# Patient Record
Sex: Male | Born: 1980 | Race: Black or African American | Hispanic: No | Marital: Single | Smoking: Current every day smoker
Health system: Southern US, Community
[De-identification: ages and names within clinical notes are randomized; demographics above are authoritative.]

## PROBLEM LIST (undated history)

## (undated) DIAGNOSIS — I10 Essential (primary) hypertension: Secondary | ICD-10-CM

## (undated) DIAGNOSIS — F329 Major depressive disorder, single episode, unspecified: Secondary | ICD-10-CM

## (undated) DIAGNOSIS — E119 Type 2 diabetes mellitus without complications: Secondary | ICD-10-CM

## (undated) DIAGNOSIS — M1990 Unspecified osteoarthritis, unspecified site: Secondary | ICD-10-CM

## (undated) DIAGNOSIS — F419 Anxiety disorder, unspecified: Secondary | ICD-10-CM

## (undated) DIAGNOSIS — F32A Depression, unspecified: Secondary | ICD-10-CM

## (undated) HISTORY — DX: Type 2 diabetes mellitus without complications: E11.9

## (undated) HISTORY — DX: Essential (primary) hypertension: I10

---

## 2003-11-02 ENCOUNTER — Emergency Department (HOSPITAL_COMMUNITY): Admission: EM | Admit: 2003-11-02 | Discharge: 2003-11-03 | Payer: Self-pay | Admitting: Emergency Medicine

## 2003-11-18 ENCOUNTER — Emergency Department (HOSPITAL_COMMUNITY): Admission: EM | Admit: 2003-11-18 | Discharge: 2003-11-18 | Payer: Self-pay | Admitting: Emergency Medicine

## 2005-12-28 ENCOUNTER — Emergency Department (HOSPITAL_COMMUNITY): Admission: EM | Admit: 2005-12-28 | Discharge: 2005-12-28 | Payer: Self-pay | Admitting: Emergency Medicine

## 2007-10-16 ENCOUNTER — Emergency Department (HOSPITAL_COMMUNITY): Admission: EM | Admit: 2007-10-16 | Discharge: 2007-10-16 | Payer: Self-pay | Admitting: Emergency Medicine

## 2008-08-09 ENCOUNTER — Emergency Department (HOSPITAL_COMMUNITY): Admission: AC | Admit: 2008-08-09 | Discharge: 2008-08-10 | Payer: Self-pay

## 2010-08-07 LAB — POCT I-STAT, CHEM 8
BUN: 15 mg/dL (ref 6–23)
Calcium, Ion: 1.01 mmol/L — ABNORMAL LOW (ref 1.12–1.32)
Creatinine, Ser: 1 mg/dL (ref 0.4–1.5)
Glucose, Bld: 98 mg/dL (ref 70–99)
HCT: 44 % (ref 39.0–52.0)
Potassium: 3.3 meq/L — ABNORMAL LOW (ref 3.5–5.1)
Sodium: 140 meq/L (ref 135–145)

## 2010-08-07 LAB — TYPE AND SCREEN: ABO/RH(D): O POS

## 2010-08-07 LAB — ABO/RH: ABO/RH(D): O POS

## 2011-09-28 ENCOUNTER — Emergency Department (HOSPITAL_COMMUNITY): Payer: Self-pay | Admitting: Certified Registered"

## 2011-09-28 ENCOUNTER — Encounter (HOSPITAL_COMMUNITY): Payer: Self-pay

## 2011-09-28 ENCOUNTER — Observation Stay (HOSPITAL_COMMUNITY)
Admission: EM | Admit: 2011-09-28 | Discharge: 2011-09-29 | Disposition: A | Discharge status: Home / Self Care | Payer: Self-pay | Attending: Emergency Medicine | Admitting: Emergency Medicine

## 2011-09-28 ENCOUNTER — Other Ambulatory Visit: Payer: Self-pay

## 2011-09-28 ENCOUNTER — Encounter (HOSPITAL_COMMUNITY): Payer: Self-pay | Admitting: Certified Registered"

## 2011-09-28 ENCOUNTER — Encounter (HOSPITAL_COMMUNITY)
Admission: EM | Disposition: A | Discharge status: Home / Self Care | Payer: Self-pay | Source: Home / Self Care | Attending: Emergency Medicine

## 2011-09-28 ENCOUNTER — Emergency Department (HOSPITAL_COMMUNITY): Payer: Self-pay

## 2011-09-28 ENCOUNTER — Observation Stay (HOSPITAL_COMMUNITY): Payer: Self-pay

## 2011-09-28 ENCOUNTER — Encounter (HOSPITAL_COMMUNITY): Payer: Self-pay | Admitting: *Deleted

## 2011-09-28 DIAGNOSIS — S82109B Unspecified fracture of upper end of unspecified tibia, initial encounter for open fracture type I or II: Principal | ICD-10-CM | POA: Insufficient documentation

## 2011-09-28 DIAGNOSIS — S81009A Unspecified open wound, unspecified knee, initial encounter: Secondary | ICD-10-CM | POA: Insufficient documentation

## 2011-09-28 DIAGNOSIS — W3400XA Accidental discharge from unspecified firearms or gun, initial encounter: Secondary | ICD-10-CM

## 2011-09-28 DIAGNOSIS — S51809A Unspecified open wound of unspecified forearm, initial encounter: Secondary | ICD-10-CM | POA: Insufficient documentation

## 2011-09-28 DIAGNOSIS — S91009A Unspecified open wound, unspecified ankle, initial encounter: Secondary | ICD-10-CM | POA: Insufficient documentation

## 2011-09-28 HISTORY — PX: IRRIGATION AND DEBRIDEMENT KNEE: SHX5185

## 2011-09-28 HISTORY — PX: KNEE ARTHROSCOPY: SHX127

## 2011-09-28 HISTORY — DX: Major depressive disorder, single episode, unspecified: F32.9

## 2011-09-28 HISTORY — DX: Depression, unspecified: F32.A

## 2011-09-28 HISTORY — PX: INCISION AND DRAINAGE OF WOUND: SHX1803

## 2011-09-28 LAB — POCT I-STAT, CHEM 8
BUN: 17 mg/dL (ref 6–23)
Creatinine, Ser: 1 mg/dL (ref 0.50–1.35)
Glucose, Bld: 146 mg/dL — ABNORMAL HIGH (ref 70–99)
Potassium: 3.3 mEq/L — ABNORMAL LOW (ref 3.5–5.1)
Sodium: 141 mEq/L (ref 135–145)
TCO2: 22 mmol/L (ref 0–100)

## 2011-09-28 LAB — CBC
MCHC: 34 g/dL (ref 30.0–36.0)
MCV: 85.9 fL (ref 78.0–100.0)

## 2011-09-28 LAB — COMPREHENSIVE METABOLIC PANEL
ALT: 29 U/L (ref 0–53)
AST: 21 U/L (ref 0–37)
Albumin: 3.6 g/dL (ref 3.5–5.2)
Alkaline Phosphatase: 97 U/L (ref 39–117)
CO2: 24 mEq/L (ref 19–32)
Calcium: 9.1 mg/dL (ref 8.4–10.5)
GFR calc Af Amer: >90 mL/min (ref >90)
GFR calc non Af Amer: >90 mL/min (ref >90)
Potassium: 3.2 mEq/L — ABNORMAL LOW (ref 3.5–5.1)
Total Bilirubin: 0.3 mg/dL (ref 0.3–1.2)
Total Protein: 7.1 g/dL (ref 6.0–8.3)

## 2011-09-28 LAB — PROTIME-INR: INR: 1.09 (ref 0.00–1.49)

## 2011-09-28 SURGERY — ARTHROSCOPY, KNEE
Anesthesia: General | Site: Knee | Laterality: Right | Wound class: Dirty or Infected

## 2011-09-28 MED ORDER — OXYCODONE HCL 5 MG PO TABS
5.0000 mg | ORAL_TABLET | ORAL | Status: DC | PRN
  Administered 2011-09-28 – 2011-09-29 (×5): 10 mg via ORAL
  Filled 2011-09-28 (×5): qty 2

## 2011-09-28 MED ORDER — CEFAZOLIN SODIUM 1-5 GM-% IV SOLN
1.0000 g | Freq: Four times a day (QID) | INTRAVENOUS | Status: AC
  Administered 2011-09-28 – 2011-09-29 (×3): 1 g via INTRAVENOUS
  Filled 2011-09-28 (×3): qty 50

## 2011-09-28 MED ORDER — METOCLOPRAMIDE HCL 5 MG/ML IJ SOLN: 5.0000 mg | Freq: Three times a day (TID) | INTRAMUSCULAR | Status: DC | PRN

## 2011-09-28 MED ORDER — CEFAZOLIN SODIUM 1-5 GM-% IV SOLN
INTRAVENOUS | Status: AC
  Administered 2011-09-28: 1000 mg
  Administered 2011-09-28: 1 g via INTRAVENOUS
  Filled 2011-09-28: qty 50

## 2011-09-28 MED ORDER — ONDANSETRON HCL 4 MG PO TABS: 4.0000 mg | ORAL_TABLET | Freq: Four times a day (QID) | ORAL | Status: DC | PRN

## 2011-09-28 MED ORDER — SODIUM CHLORIDE 0.9 % IR SOLN
Status: DC | PRN
  Administered 2011-09-28: 6000 mL

## 2011-09-28 MED ORDER — SUCCINYLCHOLINE CHLORIDE 20 MG/ML IJ SOLN
INTRAMUSCULAR | Status: DC | PRN
  Administered 2011-09-28: 100 mg via INTRAVENOUS

## 2011-09-28 MED ORDER — POTASSIUM CHLORIDE IN NACL 20-0.9 MEQ/L-% IV SOLN
INTRAVENOUS | Status: AC
  Administered 2011-09-28: 16:00:00 via INTRAVENOUS
  Filled 2011-09-28: qty 1000

## 2011-09-28 MED ORDER — FENTANYL CITRATE 0.05 MG/ML IJ SOLN
INTRAMUSCULAR | Status: AC
  Administered 2011-09-28: 50 ug
  Filled 2011-09-28: qty 2

## 2011-09-28 MED ORDER — KETOROLAC TROMETHAMINE 30 MG/ML IJ SOLN
30.0000 mg | Freq: Once | INTRAMUSCULAR | Status: AC
  Administered 2011-09-28: 30 mg via INTRAVENOUS

## 2011-09-28 MED ORDER — CLONIDINE HCL (ANALGESIA) 100 MCG/ML EP SOLN
150.0000 ug | Freq: Once | EPIDURAL | Status: DC
  Filled 2011-09-28: qty 1.5

## 2011-09-28 MED ORDER — MORPHINE SULFATE 4 MG/ML IJ SOLN
8.0000 mg | Freq: Once | INTRAMUSCULAR | Status: DC
  Filled 2011-09-28: qty 2

## 2011-09-28 MED ORDER — METHOCARBAMOL 500 MG PO TABS
500.0000 mg | ORAL_TABLET | Freq: Four times a day (QID) | ORAL | Status: DC | PRN
  Administered 2011-09-28 – 2011-09-29 (×2): 500 mg via ORAL
  Filled 2011-09-28 (×2): qty 1

## 2011-09-28 MED ORDER — ONDANSETRON HCL 4 MG/2ML IJ SOLN: 4.0000 mg | Freq: Four times a day (QID) | INTRAMUSCULAR | Status: DC | PRN

## 2011-09-28 MED ORDER — PROMETHAZINE HCL 25 MG/ML IJ SOLN: 6.2500 mg | INTRAMUSCULAR | Status: DC | PRN

## 2011-09-28 MED ORDER — HYDROMORPHONE HCL PF 1 MG/ML IJ SOLN: 0.2500 mg | INTRAMUSCULAR | Status: DC | PRN

## 2011-09-28 MED ORDER — MIDAZOLAM HCL 5 MG/5ML IJ SOLN
INTRAMUSCULAR | Status: DC | PRN
  Administered 2011-09-28 (×2): 1 mg via INTRAVENOUS

## 2011-09-28 MED ORDER — FENTANYL CITRATE 0.05 MG/ML IJ SOLN
INTRAMUSCULAR | Status: DC | PRN
  Administered 2011-09-28: 100 ug via INTRAVENOUS
  Administered 2011-09-28 (×2): 150 ug via INTRAVENOUS
  Administered 2011-09-28: 50 ug via INTRAVENOUS

## 2011-09-28 MED ORDER — TETANUS-DIPHTH-ACELL PERTUSSIS 5-2.5-18.5 LF-MCG/0.5 IM SUSP
INTRAMUSCULAR | Status: AC
  Administered 2011-09-28: 0.5 mL via INTRAMUSCULAR
  Filled 2011-09-28: qty 0.5

## 2011-09-28 MED ORDER — TETANUS-DIPHTH-ACELL PERTUSSIS 5-2.5-18.5 LF-MCG/0.5 IM SUSP: 0.5000 mL | Freq: Once | INTRAMUSCULAR | Status: DC

## 2011-09-28 MED ORDER — LACTATED RINGERS IV SOLN
INTRAVENOUS | Status: DC | PRN
  Administered 2011-09-28: 10:00:00 via INTRAVENOUS

## 2011-09-28 MED ORDER — METHOCARBAMOL 100 MG/ML IJ SOLN
500.0000 mg | Freq: Four times a day (QID) | INTRAVENOUS | Status: DC | PRN
  Filled 2011-09-28: qty 5

## 2011-09-28 MED ORDER — ACETAMINOPHEN 10 MG/ML IV SOLN
INTRAVENOUS | Status: DC | PRN
  Administered 2011-09-28: 1000 mg via INTRAVENOUS

## 2011-09-28 MED ORDER — SODIUM CHLORIDE 0.9 % IR SOLN
Status: DC | PRN
  Administered 2011-09-28: 1000 mL

## 2011-09-28 MED ORDER — HYDROMORPHONE HCL PF 1 MG/ML IJ SOLN: 0.5000 mg | INTRAMUSCULAR | Status: DC | PRN

## 2011-09-28 MED ORDER — ONDANSETRON HCL 4 MG/2ML IJ SOLN
INTRAMUSCULAR | Status: DC | PRN
  Administered 2011-09-28: 4 mg via INTRAVENOUS

## 2011-09-28 MED ORDER — CIPROFLOXACIN IN D5W 400 MG/200ML IV SOLN
400.0000 mg | Freq: Two times a day (BID) | INTRAVENOUS | Status: AC
  Administered 2011-09-28 – 2011-09-29 (×2): 400 mg via INTRAVENOUS
  Filled 2011-09-28 (×2): qty 200

## 2011-09-28 MED ORDER — MIDAZOLAM HCL 2 MG/2ML IJ SOLN: 0.5000 mg | Freq: Once | INTRAMUSCULAR | Status: DC | PRN

## 2011-09-28 MED ORDER — METOCLOPRAMIDE HCL 10 MG PO TABS: 5.0000 mg | ORAL_TABLET | Freq: Three times a day (TID) | ORAL | Status: DC | PRN

## 2011-09-28 MED ORDER — MEPERIDINE HCL 25 MG/ML IJ SOLN: 6.2500 mg | INTRAMUSCULAR | Status: DC | PRN

## 2011-09-28 MED ORDER — PROPOFOL 10 MG/ML IV EMUL
INTRAVENOUS | Status: DC | PRN
  Administered 2011-09-28: 200 mg via INTRAVENOUS

## 2011-09-28 SURGICAL SUPPLY — 79 items
BANDAGE ELASTIC 4 VELCRO ST LF (GAUZE/BANDAGES/DRESSINGS) ×6 IMPLANT
BANDAGE ELASTIC 6 VELCRO ST LF (GAUZE/BANDAGES/DRESSINGS) ×3 IMPLANT
BANDAGE ESMARK 6X9 LF (GAUZE/BANDAGES/DRESSINGS) ×2 IMPLANT
BENZOIN TINCTURE PRP APPL 2/3 (GAUZE/BANDAGES/DRESSINGS) IMPLANT
BLADE CUDA 5.5 (BLADE) IMPLANT
BLADE GREAT WHITE 4.2 (BLADE) IMPLANT
BLADE SURG 10 STRL SS (BLADE) IMPLANT
BLADE SURG 11 STRL SS (BLADE) IMPLANT
BLADE SURG 15 STRL LF DISP TIS (BLADE) IMPLANT
BLADE SURG 15 STRL SS (BLADE)
BLADE SURG ROTATE 9660 (MISCELLANEOUS) IMPLANT
BNDG ESMARK 6X9 LF (GAUZE/BANDAGES/DRESSINGS) ×3
BUR OVAL 6.0 (BURR) IMPLANT
CANNULA ACUFLEX KIT 5X76 (CANNULA) ×3 IMPLANT
CLOTH BEACON ORANGE TIMEOUT ST (SAFETY) ×3 IMPLANT
CONT SPEC STER OR (MISCELLANEOUS) ×3 IMPLANT
COVER MAYO STAND STRL (DRAPES) IMPLANT
COVER SURGICAL LIGHT HANDLE (MISCELLANEOUS) ×3 IMPLANT
CUFF TOURNIQUET SINGLE 34IN LL (TOURNIQUET CUFF) ×3 IMPLANT
CUFF TOURNIQUET SINGLE 44IN (TOURNIQUET CUFF) IMPLANT
DRAPE ARTHROSCOPY W/POUCH 114 (DRAPES) ×3 IMPLANT
DRAPE INCISE IOBAN 66X45 STRL (DRAPES) ×3 IMPLANT
DRAPE PROXIMA HALF (DRAPES) IMPLANT
DRAPE U-SHAPE 47X51 STRL (DRAPES) ×6 IMPLANT
DRSG PAD ABDOMINAL 8X10 ST (GAUZE/BANDAGES/DRESSINGS) ×6 IMPLANT
DURAPREP 26ML APPLICATOR (WOUND CARE) IMPLANT
ELECT REM PT RETURN 9FT ADLT (ELECTROSURGICAL)
ELECTRODE REM PT RTRN 9FT ADLT (ELECTROSURGICAL) IMPLANT
GAUZE XEROFORM 1X8 LF (GAUZE/BANDAGES/DRESSINGS) ×6 IMPLANT
GLOVE BIO SURGEON ST LM GN SZ9 (GLOVE) IMPLANT
GLOVE BIOGEL PI IND STRL 8 (GLOVE) ×2 IMPLANT
GLOVE BIOGEL PI IND STRL 9 (GLOVE) IMPLANT
GLOVE BIOGEL PI INDICATOR 8 (GLOVE) ×1
GLOVE BIOGEL PI INDICATOR 9 (GLOVE)
GLOVE SURG ORTHO 8.0 STRL STRW (GLOVE) ×3 IMPLANT
GOWN PREVENTION PLUS LG XLONG (DISPOSABLE) IMPLANT
GOWN PREVENTION PLUS XLARGE (GOWN DISPOSABLE) IMPLANT
GOWN STRL NON-REIN LRG LVL3 (GOWN DISPOSABLE) ×6 IMPLANT
IMMOBILIZER KNEE 22 UNIV (SOFTGOODS) ×3 IMPLANT
KIT BASIN OR (CUSTOM PROCEDURE TRAY) ×3 IMPLANT
KIT ROOM TURNOVER OR (KITS) ×3 IMPLANT
MANIFOLD NEPTUNE II (INSTRUMENTS) ×3 IMPLANT
NEEDLE 18GX1X1/2 (RX/OR ONLY) (NEEDLE) ×3 IMPLANT
NEEDLE HYPO 25GX1X1/2 BEV (NEEDLE) ×3 IMPLANT
NEEDLE SPNL 18GX3.5 QUINCKE PK (NEEDLE) IMPLANT
NS IRRIG 1000ML POUR BTL (IV SOLUTION) IMPLANT
PACK ARTHROSCOPY DSU (CUSTOM PROCEDURE TRAY) ×3 IMPLANT
PAD ARMBOARD 7.5X6 YLW CONV (MISCELLANEOUS) ×6 IMPLANT
PAD CAST 4YDX4 CTTN HI CHSV (CAST SUPPLIES) ×2 IMPLANT
PADDING CAST COTTON 4X4 STRL (CAST SUPPLIES) ×1
PADDING CAST COTTON 6X4 STRL (CAST SUPPLIES) ×3 IMPLANT
PENCIL BUTTON HOLSTER BLD 10FT (ELECTRODE) IMPLANT
SET ARTHROSCOPY TUBING (MISCELLANEOUS) ×1
SET ARTHROSCOPY TUBING LN (MISCELLANEOUS) ×2 IMPLANT
SPONGE GAUZE 4X4 12PLY (GAUZE/BANDAGES/DRESSINGS) ×6 IMPLANT
SPONGE LAP 4X18 X RAY DECT (DISPOSABLE) ×3 IMPLANT
STAPLER VISISTAT 35W (STAPLE) IMPLANT
STRIP CLOSURE SKIN 1/2X4 (GAUZE/BANDAGES/DRESSINGS) IMPLANT
SUCTION FRAZIER TIP 10 FR DISP (SUCTIONS) IMPLANT
SUT ETHIBOND CT1 BRD #0 30IN (SUTURE) IMPLANT
SUT ETHILON 3 0 PS 1 (SUTURE) ×9 IMPLANT
SUT FIBERWIRE 2-0 18 17.9 3/8 (SUTURE) ×6
SUT MENISCAL KIT (KITS) IMPLANT
SUT PROLENE 3 0 PS 2 (SUTURE) IMPLANT
SUT VIC AB 0 CT1 27 (SUTURE)
SUT VIC AB 0 CT1 27XBRD ANBCTR (SUTURE) IMPLANT
SUT VIC AB 2-0 CTB1 (SUTURE) IMPLANT
SUT VIC AB 2-0 FS1 27 (SUTURE) ×12 IMPLANT
SUT VIC AB 3-0 FS2 27 (SUTURE) ×9 IMPLANT
SUTURE FIBERWR 2-0 18 17.9 3/8 (SUTURE) ×4 IMPLANT
SYR 20ML ECCENTRIC (SYRINGE) IMPLANT
SYR BULB IRRIGATION 50ML (SYRINGE) IMPLANT
SYR CONTROL 10ML LL (SYRINGE) ×3 IMPLANT
SYR TB 1ML LUER SLIP (SYRINGE) ×3 IMPLANT
TOWEL OR 17X24 6PK STRL BLUE (TOWEL DISPOSABLE) ×3 IMPLANT
TOWEL OR 17X26 10 PK STRL BLUE (TOWEL DISPOSABLE) ×3 IMPLANT
TUBE CONNECTING 12X1/4 (SUCTIONS) ×3 IMPLANT
WAND 90 DEG TURBOVAC W/CORD (SURGICAL WAND) ×3 IMPLANT
WATER STERILE IRR 1000ML POUR (IV SOLUTION) ×3 IMPLANT

## 2011-09-28 NOTE — Anesthesia Postprocedure Evaluation (Signed)
  Anesthesia Post-op Note  Patient: Randy Potter  Procedure(s) Performed: Procedure(s) (LRB): ARTHROSCOPY KNEE (Left) IRRIGATION AND DEBRIDEMENT KNEE (Left) IRRIGATION AND DEBRIDEMENT WOUND (Right)  Patient Location: PACU  Anesthesia Type: General  Level of Consciousness: awake, alert  and oriented  Airway and Oxygen Therapy: Patient Spontanous Breathing and Patient connected to nasal cannula oxygen  Post-op Pain: none  Post-op Assessment: Post-op Vital signs reviewed, Patient's Cardiovascular Status Stable, Respiratory Function Stable, Patent Airway, No signs of Nausea or vomiting and Pain level controlled  Post-op Vital Signs: Reviewed and stable  Complications: No apparent anesthesia complications
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
## 2011-09-28 NOTE — ED Notes (Signed)
Gave patients mother the pass word. Last 4 #s of the csn.  [phone]

## 2011-09-28 NOTE — Progress Notes (Signed)
09/28/11 1058  OTHER  CSW Follow Up Status Follow-up required     Unit based LCSW will be available for psychosocial support and disposition needs.

## 2011-09-28 NOTE — Anesthesia Preprocedure Evaluation (Signed)
Anesthesia Evaluation  Patient identified by MRN, date of birth, ID band Patient awake    Reviewed: Allergy & Precautions, H&P , NPO status , Patient's Chart, lab work & pertinent test results  History of Anesthesia Complications Negative for: history of anesthetic complications  Airway Mallampati: II TM Distance: >3 FB Neck ROM: Full    Dental No notable dental hx. (+) Teeth Intact and Dental Advisory Given   Pulmonary Current Smoker (tobacco and marijuana),  breath sounds clear to auscultation  Pulmonary exam normal       Cardiovascular negative cardio ROS  Rhythm:Regular Rate:Normal     Neuro/Psych negative neurological ROS  negative psych ROS   GI/Hepatic negative GI ROS, Neg liver ROS,   Endo/Other  negative endocrine ROS  Renal/GU negative Renal ROS     Musculoskeletal   Abdominal (+) + obese,   Peds  Hematology negative hematology ROS (+)   Anesthesia Other Findings   Reproductive/Obstetrics                           Anesthesia Physical Anesthesia Plan  ASA: II and Emergent  Anesthesia Plan: General   Post-op Pain Management:    Induction: Intravenous, Rapid sequence and Cricoid pressure planned  Airway Management Planned: Oral ETT  Additional Equipment:   Intra-op Plan:   Post-operative Plan: Extubation in OR  Informed Consent: I have reviewed the patients History and Physical, chart, labs and discussed the procedure including the risks, benefits and alternatives for the proposed anesthesia with the patient or authorized representative who has indicated his/her understanding and acceptance.   Dental advisory given  Plan Discussed with: CRNA and Surgeon  Anesthesia Plan Comments: (Plan routine monitors, GETA with RSI)        Anesthesia Quick Evaluation
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
## 2011-09-28 NOTE — ED Notes (Signed)
Dr. August Saucer at the bedside.

## 2011-09-28 NOTE — ED Provider Notes (Signed)
Medical screening examination/treatment/procedure(s) were performed by non-physician practitioner and as supervising physician I was immediately available for consultation/collaboration.  Sunnie Nielsen, MD 09/28/11 2300

## 2011-09-28 NOTE — ED Notes (Signed)
Pt taken to OR.
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
## 2011-09-28 NOTE — Anesthesia Procedure Notes (Signed)
Procedure Name: Intubation Date/Time: 09/28/2011 10:42 AM Performed by: Arlice Colt B Pre-anesthesia Checklist: Patient identified, Emergency Drugs available, Suction available, Patient being monitored and Timeout performed Patient Re-evaluated:Patient Re-evaluated prior to inductionOxygen Delivery Method: Circle system utilized Preoxygenation: Pre-oxygenation with 100% oxygen Intubation Type: IV induction and Rapid sequence Laryngoscope Size: Mac and 3 Grade View: Grade I Tube type: Oral Tube size: 7.5 mm Number of attempts: 1 Airway Equipment and Method: Stylet Placement Confirmation: ETT inserted through vocal cords under direct vision,  positive ETCO2 and breath sounds checked- equal and bilateral Secured at: 23 cm Tube secured with: Tape Dental Injury: Teeth and Oropharynx as per pre-operative assessment

## 2011-09-28 NOTE — Transfer of Care (Signed)
Immediate Anesthesia Transfer of Care Note  Patient: Randy Potter  Procedure(s) Performed: Procedure(s) (LRB): ARTHROSCOPY KNEE (Left) IRRIGATION AND DEBRIDEMENT KNEE (Left) IRRIGATION AND DEBRIDEMENT WOUND (Right)  Patient Location: PACU  Anesthesia Type: General  Level of Consciousness: awake, alert , oriented and patient cooperative  Airway & Oxygen Therapy: Patient Spontanous Breathing and Patient connected to nasal cannula oxygen  Post-op Assessment: Report given to PACU RN, Post -op Vital signs reviewed and stable and Patient moving all extremities  Post vital signs: Reviewed and stable  Complications: No apparent anesthesia complications
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
## 2011-09-29 ENCOUNTER — Encounter (HOSPITAL_COMMUNITY): Payer: Self-pay | Admitting: Orthopedic Surgery

## 2011-09-29 MED ORDER — OXYCODONE HCL 5 MG PO TABS: 5.0000 mg | ORAL_TABLET | ORAL | Status: AC | PRN

## 2011-09-29 MED ORDER — METHOCARBAMOL 500 MG PO TABS: 500.0000 mg | ORAL_TABLET | Freq: Four times a day (QID) | ORAL | Status: AC | PRN

## 2011-09-29 MED ORDER — ASPIRIN EC 325 MG PO TBEC: 325.0000 mg | DELAYED_RELEASE_TABLET | Freq: Every day | ORAL | Status: AC
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
## 2011-09-29 NOTE — Progress Notes (Signed)
Orthopedic Tech Progress Note Patient Details:  Randy Potter 1980-11-17 [id number]  Ortho Devices Type of Ortho Device: Crutches Ortho Device/Splint Interventions: Application   Nikki Dom 09/29/2011, 4:23 PM

## 2011-09-29 NOTE — Progress Notes (Signed)
Orthopedic Tech Progress Note Patient Details:  Randy Potter 27-Apr-1981 [id number]  Patient ID: [name], male   DOB: Dec 11, 1980, 31 y.o.   MRN: [id number] Viewed order from rn order list  Nikki Dom 09/29/2011, 4:24 PM

## 2011-09-29 NOTE — Progress Notes (Signed)
Utilization review completed. Tobe Kervin, RN, BSN. 09/29/11  
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
## 2013-11-13 ENCOUNTER — Encounter (HOSPITAL_COMMUNITY): Payer: Self-pay | Admitting: Emergency Medicine

## 2013-11-13 ENCOUNTER — Emergency Department (HOSPITAL_COMMUNITY)
Admission: EM | Admit: 2013-11-13 | Discharge: 2013-11-13 | Disposition: A | Discharge status: Home / Self Care | Payer: Medicaid Other | Attending: Emergency Medicine | Admitting: Emergency Medicine

## 2013-11-13 DIAGNOSIS — Z8659 Personal history of other mental and behavioral disorders: Secondary | ICD-10-CM | POA: Insufficient documentation

## 2013-11-13 DIAGNOSIS — R300 Dysuria: Secondary | ICD-10-CM | POA: Diagnosis not present

## 2013-11-13 DIAGNOSIS — F17200 Nicotine dependence, unspecified, uncomplicated: Secondary | ICD-10-CM | POA: Diagnosis not present

## 2013-11-13 DIAGNOSIS — R369 Urethral discharge, unspecified: Secondary | ICD-10-CM

## 2013-11-13 MED ORDER — CEFTRIAXONE SODIUM 250 MG IJ SOLR
250.0000 mg | Freq: Once | INTRAMUSCULAR | Status: AC
  Administered 2013-11-13: 250 mg via INTRAMUSCULAR
  Filled 2013-11-13: qty 250

## 2013-11-13 MED ORDER — LIDOCAINE HCL (PF) 1 % IJ SOLN
5.0000 mL | Freq: Once | INTRAMUSCULAR | Status: AC
  Administered 2013-11-13: 0.9 mL
  Filled 2013-11-13: qty 5

## 2013-11-13 MED ORDER — ONDANSETRON HCL 4 MG/2ML IJ SOLN: 4.0000 mg | Freq: Once | INTRAMUSCULAR | Status: DC

## 2013-11-13 MED ORDER — ONDANSETRON 4 MG PO TBDP
4.0000 mg | ORAL_TABLET | Freq: Once | ORAL | Status: AC
  Administered 2013-11-13: 4 mg via ORAL
  Filled 2013-11-13: qty 1

## 2013-11-13 MED ORDER — METRONIDAZOLE 500 MG PO TABS
2000.0000 mg | ORAL_TABLET | Freq: Once | ORAL | Status: AC
  Administered 2013-11-13: 2000 mg via ORAL
  Filled 2013-11-13: qty 4

## 2013-11-13 MED ORDER — AZITHROMYCIN 250 MG PO TABS
1000.0000 mg | ORAL_TABLET | Freq: Once | ORAL | Status: AC
  Administered 2013-11-13: 1000 mg via ORAL
  Filled 2013-11-13: qty 4

## 2013-11-13 NOTE — ED Provider Notes (Signed)
CSN: [id number]     Arrival date & time 11/13/13  1401 History  This chart was scribed for Arthor CaptainAbigail Keeghan Mcintire, PA-C non-physician practitioner working with Juliet RudeNathan R. Rubin PayorPickering, MD by Nicholos Johnsenise Iheanachor, ED scribe. This patient was seen in room TR08C/TR08C and the patient's care was started at 3:57 PM.    Chief Complaint  Patient presents with  . SEXUALLY TRANSMITTED DISEASE   The history is provided by the patient. No language interpreter was used.   HPI Comments: Addison LankWayne D Vandeusen is a 33 y.o. male who presents to the Emergency Department complaining of penile discharge w/ dysuria; onset 3 days ago. Admits to unprotected sex recently. No hx of STD. No other lesions to report. Denies testicular pain.   Past Medical History  Diagnosis Date  . Depression    Past Surgical History  Procedure Laterality Date  . Knee arthroscopy  09/28/2011    Procedure: ARTHROSCOPY KNEE;  Surgeon: Cammy CopaGregory Scott Dean, MD;  [facility];  Service: Orthopedics;  Laterality: Left;  . Irrigation and debridement knee  09/28/2011    Procedure: IRRIGATION AND DEBRIDEMENT KNEE;  Surgeon: Cammy CopaGregory Scott Dean, MD;  [facility];  Service: Orthopedics;  Laterality: Left;  . Incision and drainage of wound  09/28/2011    Procedure: IRRIGATION AND DEBRIDEMENT WOUND;  Surgeon: Cammy CopaGregory Scott Dean, MD;  [facility];  Service: Orthopedics;  Laterality: Right;  Complex closure of laceration   History reviewed. No pertinent family history. History  Substance Use Topics  . Smoking status: Current Every Day Smoker -- 0.50 packs/day for 15 years    Types: Cigarettes  . Smokeless tobacco: Not on file  . Alcohol Use: 1.8 oz/week    3 Shots of liquor per week    Review of Systems  Constitutional: Negative for fever and chills.  Respiratory: Negative for cough and shortness of breath.   Cardiovascular: Negative for chest pain and palpitations.  Gastrointestinal: Negative for vomiting, abdominal pain, diarrhea and constipation.   Genitourinary: Positive for dysuria and discharge. Negative for urgency, frequency, penile swelling, penile pain and testicular pain.  Musculoskeletal: Negative for arthralgias and myalgias.  Skin: Negative for rash.  Neurological: Negative for headaches.  All other systems reviewed and are negative.  Allergies  Review of patient's allergies indicates no known allergies.  Home Medications   Prior to Admission medications   Not on File   Triage vitals: BP 117/62  Pulse 65  Temp(Src) 98.1 F (36.7 C) (Oral)  Resp 16  Ht 5\' 8"  (1.727 m)  Wt 214 lb 8 oz (97.297 kg)  BMI 32.62 kg/m2  SpO2 99%  Physical Exam  Nursing note and vitals reviewed. Constitutional: He is oriented to person, place, and time. He appears well-developed and well-nourished. No distress.  HENT:  Head: Normocephalic and atraumatic.  Eyes: Conjunctivae and EOM are normal.  Neck: Neck supple. No tracheal deviation present.  Cardiovascular: Normal rate.   Pulmonary/Chest: Effort normal. No respiratory distress.  Genitourinary: Testes normal. Right testis shows no tenderness. Left testis shows no tenderness. Circumcised. Discharge found.  creamy discharge at urethral meatus. No reactive lymphadenopathy at the groin. No testicular pain or swelling.  Musculoskeletal: Normal range of motion.  Lymphadenopathy:       Right: No inguinal adenopathy present.       Left: No inguinal adenopathy present.  Neurological: He is alert and oriented to person, place, and time.  Skin: Skin is warm and dry.  Psychiatric: He has a normal mood and affect. His  behavior is normal.    ED Course  Procedures (including critical care time) DIAGNOSTIC STUDIES: Oxygen Saturation is 99% on room air, normal by my interpretation.    COORDINATION OF CARE: At 4:00 PM: Discussed treatment plan with patient which includes swab for culture. Patient agrees.    Labs Review Labs Reviewed - No data to display  Imaging Review No results  found.   EKG Interpretation None      MDM   Final diagnoses:  Penile discharge  Dysuria   Discussed importance of using protection when sexually active. Pt understands that they have GC/Chlamydia cultures pending and that they will need to inform all sexual partners if results return positive. Pt has been treated prophylacticly with azithromycin and rocephinPt has also been treated with flagyl for possible trich. Pt has been advised to not drink alcohol while on this medication.   I personally performed the services described in this documentation, which was scribed in my presence. The recorded information has been reviewed and is accurate.     Arthor Captain, PA-C 11/15/13 2158

## 2013-11-13 NOTE — ED Notes (Signed)
Pt states "i want to be checked for diseases. Ive had discharge coming from my penis for 3 days.

## 2013-11-13 NOTE — Discharge Instructions (Signed)

## 2013-11-15 LAB — GC/CHLAMYDIA PROBE AMP
CT Probe RNA: NEGATIVE
GC Probe RNA: POSITIVE — AB

## 2013-11-16 NOTE — ED Provider Notes (Signed)
Medical screening examination/treatment/procedure(s) were performed by non-physician practitioner and as supervising physician I was immediately available for consultation/collaboration.   EKG Interpretation None       Naiya Corral R. Alantis Bethune, MD 11/16/13 1328 

## 2013-11-17 ENCOUNTER — Telehealth (HOSPITAL_BASED_OUTPATIENT_CLINIC_OR_DEPARTMENT_OTHER): Payer: Self-pay | Admitting: Emergency Medicine

## 2013-11-17 NOTE — Telephone Encounter (Signed)
+  Gonorrhea. Patient treated with Rocephin and Zithromax. [employer] faxed. 

## 2013-12-02 ENCOUNTER — Encounter (HOSPITAL_COMMUNITY): Payer: Self-pay | Admitting: Emergency Medicine

## 2013-12-02 ENCOUNTER — Emergency Department (HOSPITAL_COMMUNITY)
Admission: EM | Admit: 2013-12-02 | Discharge: 2013-12-02 | Disposition: A | Discharge status: Home / Self Care | Payer: Medicaid Other | Attending: Emergency Medicine | Admitting: Emergency Medicine

## 2013-12-02 DIAGNOSIS — Z8659 Personal history of other mental and behavioral disorders: Secondary | ICD-10-CM | POA: Insufficient documentation

## 2013-12-02 DIAGNOSIS — M7520 Bicipital tendinitis, unspecified shoulder: Secondary | ICD-10-CM | POA: Diagnosis not present

## 2013-12-02 DIAGNOSIS — F17200 Nicotine dependence, unspecified, uncomplicated: Secondary | ICD-10-CM | POA: Diagnosis not present

## 2013-12-02 DIAGNOSIS — M25519 Pain in unspecified shoulder: Secondary | ICD-10-CM | POA: Diagnosis present

## 2013-12-02 DIAGNOSIS — M7521 Bicipital tendinitis, right shoulder: Secondary | ICD-10-CM

## 2013-12-02 MED ORDER — MELOXICAM 7.5 MG PO TABS: 15.0000 mg | ORAL_TABLET | Freq: Every day | ORAL | Status: DC

## 2013-12-02 MED ORDER — TRAMADOL HCL 50 MG PO TABS
50.0000 mg | ORAL_TABLET | Freq: Four times a day (QID) | ORAL | Status: DC | PRN
Start: 2013-12-02 — End: 2018-07-05

## 2013-12-02 NOTE — Discharge Instructions (Signed)
Biceps Tendon Tendinitis (Proximal) and Tenosynovitis with Rehab Tendonitis and tenosynovitis involve inflammation of the tendon and the tendon lining (sheath). The proximal biceps tendon is vulnerable to tendonitis and tenosynovitis, which causes pain and discomfort in the front of the shoulder and upper arm. The tendon lining secretes a fluid that helps lubricate the tendon, allowing for proper function without pain. When the tendon and its lining become inflamed, the tendon can no longer glide smoothly, causing pain. The proximal biceps tendon connects the biceps muscle to two bones of the shoulder. It is important for proper function of the elbow and turning the palm upward (supination) using the wrist. Proximal biceps tendon tendinitis may include a grade 1 or 2 strain of the tendon. Grade 1 strains involve a slight pull of the tendon without signs of tearing and no observed tendon lengthening. There is also no loss of strength. Grade 2 strains involve small tears in the tendon fibers. The tendon or muscle is stretched and strength is usually decreased.  SYMPTOMS   Pain, tenderness, swelling, warmth, or redness over the front of the shoulder.  Pain that gets worse with shoulder and elbow use, especially against resistance.  Limited motion of the shoulder or elbow.  Crackling sound (crepitation) when the tendon or shoulder is moved or touched. CAUSES  The symptoms of biceps tendonitis are due to inflammation of the tendon. Inflammation may be caused by:  Strain from sudden increase in amount or intensity of activity.  Direct blow or injury to the elbow (uncommon).  Overuse or repetitive elbow bending or wrist rotation, particularly when turning the palm up, or with elbow hyperextension. RISK INCREASES WITH:  Sports that involve contact or overhead arm activity (throwing sports, gymnastics, weightlifting, bodybuilding, rock climbing).  Heavy labor.  Poor strength and  flexibility.  Failure to warm up properly before activity. PREVENTION  Warm up and stretch properly before activity.  Allow time for recovery between activities.  Maintain physical fitness:  Strength, flexibility, and endurance.  Cardiovascular fitness.  Learn and use proper exercise technique. PROGNOSIS  With proper treatment, proximal biceps tendon tendonitis and tenosynovitis is usually curable within 6 weeks. Healing is usually quicker if the cause was a direct blow, not overuse.  RELATED COMPLICATIONS   Longer healing time if not properly treated or if not given enough time to heal.  Chronically inflamed tendon that causes persistent pain with activity, that may progress to constant pain and potentially rupture of the tendon.  Recurring symptoms, especially if activity is resumed too soon or with overuse, a direct blow, or use of poor exercise technique. TREATMENT Treatment first involves ice and medicine, to reduce pain and inflammation. It is helpful to modify activities that cause pain, to reduce the chances of causing the condition to get worse. Strengthening and stretching exercises should be performed to promote proper use of the muscles of the shoulder. These exercises may be performed at home or with a therapist. Other treatments may be given such as ultrasound or heat therapy. A corticosteroid injection may be recommended to help reduce inflammation of the tendon lining. Surgery is usually not necessary. Sometimes, if symptoms last for greater than 6 months, surgery will be advised to detach the tendon and re-insert it into the arm bone. Surgery to correct other shoulder problems that may be contributing to tendinitis may be advised before surgery for the tendinitis itself.  MEDICATION  If pain medicine is needed, nonsteroidal anti-inflammatory medicines (aspirin and ibuprofen), or other minor pain relievers (  acetaminophen), are often advised.  Do not take pain medicine  for 7 days before surgery.  Prescription pain relievers may be given if your caregiver thinks they are needed. Use only as directed and only as much as you need.  Corticosteroid injections may be given. These injections should only be used on the most severe cases, as one can only receive a limited number of them. HEAT AND COLD   Cold treatment (icing) should be applied for 10 to 15 minutes every 2 to 3 hours for inflammation and pain, and immediately after activity that aggravates your symptoms. Use ice packs or an ice massage.  Heat treatment may be used before performing stretching and strengthening activities prescribed by your caregiver, physical therapist, or athletic trainer. Use a heat pack or a warm water soak. SEEK MEDICAL CARE IF:   Symptoms get worse or do not improve in 2 weeks, despite treatment.  New, unexplained symptoms develop. (Drugs used in treatment may produce side effects.) EXERCISES RANGE OF MOTION (ROM) AND EXERCISES - Biceps Tendon (Proximal) and Tenosynovitis These exercises may help you when beginning to rehabilitate your injury. Your symptoms may go away with or without further involvement from your physician, physical therapist, or athletic trainer. While completing these exercises, remember:   Restoring tissue flexibility helps normal motion to return to the joints. This allows healthier, less painful movement and activity.  An effective stretch should be held for at least 30 seconds.  A stretch should never be painful. You should only feel a gentle lengthening or release in the stretched tissue. STRETCH - Flexion, Standing  Stand with good posture. With an underhand grip on your right / left hand and an overhand grip on the opposite hand, grasp a broomstick or cane so that your hands are a little more than shoulder width apart.  Keeping your right / left elbow straight and shoulder muscles relaxed, push the stick with your opposite hand to raise your right  / left arm in front of your body and then overhead. Raise your arm until you feel a stretch in your right / left shoulder, but before you have increased shoulder pain.  Try to avoid shrugging your right / left shoulder as your arm rises, by keeping your shoulder blade tucked down and toward your mid-back spine. Hold for __________ seconds.  Slowly return to the starting position. Repeat __________ times. Complete this exercise __________ times per day. STRETCH - Abduction, Supine  Lie on your back. With an underhand grip on your right / left hand and an overhand grip on the opposite hand, grasp a broomstick or cane so that your hands are a little more than shoulder width apart.  Keeping your right / left elbow straight and shoulder muscles relaxed, push the stick with your opposite hand to raise your right / left arm out to the side of your body and then overhead. Raise your arm until you feel a stretch in your right / left shoulder, but before you have increased shoulder pain.  Try to avoid shrugging your right / left shoulder as your arm rises, by keeping your shoulder blade tucked down and toward your mid-back spine. Hold for __________ seconds.  Slowly return to the starting position. Repeat __________ times. Complete this exercise __________ times per day. ROM - Flexion, Active-Assisted  Lie on your back. You may bend your knees for comfort.  Grasp a broomstick or cane so your hands are about shoulder width apart. Your right / left hand should   grip the end of the stick so that your hand is positioned "thumbs-up," as if you were about to shake hands.  Using your healthy arm to lead, raise your right / left arm overhead until you feel a gentle stretch in your shoulder. Hold for __________ seconds.  Use the stick to assist in returning your right / left arm to its starting position. Repeat __________ times. Complete this exercise __________ times per day.  STRETCH - Flexion, Standing    Stand facing a wall. Walk your right / left fingers up the wall until you feel a moderate stretch in your shoulder. As your hand gets higher, you may need to step closer to the wall or use a door frame to walk through.  Try to avoid shrugging your right / left shoulder as your arm rises, by keeping your shoulder blade tucked down and toward your mid-back spine.  Hold for __________ seconds. Use your other hand, if needed, to ease out of the stretch and return to the starting position. Repeat __________ times. Complete this exercise __________ times per day.  ROM - Internal Rotation   Using underhand grips, grasp a stick behind your back with both hands.  While standing upright with good posture, slide the stick up your back until you feel a mild stretch in the front of your shoulder.  Hold for __________ seconds. Slowly return to your starting position. Repeat __________ times. Complete this exercise __________ times per day.  STRETCH - Internal Rotation  Place your right / left hand behind your back, palm-up.  Throw a towel or belt over your opposite shoulder. Grasp the towel with your right / left hand.  While keeping an upright posture, gently pull up on the towel until you feel a stretch in the front of your right / left shoulder.  Avoid shrugging your right / left shoulder as your arm rises, by keeping your shoulder blade tucked down and toward your mid-back spine.  Hold for __________ seconds. Release the stretch by lowering your opposite hand. Repeat __________ times. Complete this exercise __________ times per day. STRENGTHENING EXERCISES - Biceps Tendon Tendinitis (Proximal) and Tenosynovitis These exercises may help you regain your strength after your physician has discontinued your restraint in a cast or brace. They may resolve your symptoms with or without further involvement from your physician, physical therapist or athletic trainer. While completing these exercises,  remember:   Muscles can gain both the endurance and the strength needed for everyday activities through controlled exercises.  Complete these exercises as instructed by your physician, physical therapist or athletic trainer. Increase the resistance and repetitions only as guided.  You may experience muscle soreness or fatigue, but the pain or discomfort you are trying to eliminate should never worsen during these exercises. If this pain does get worse, stop and make sure you are following the directions exactly. If the pain is still present after adjustments, discontinue the exercise until you can discuss the trouble with your caregiver. STRENGTH - Elbow Flexors, Isometric  Stand or sit upright on a firm surface. Place your right / left arm so that your hand is palm-up and at the height of your waist.  Place your opposite hand on top of your forearm. Gently push down as your right / left arm resists. Push as hard as you can with both arms, without causing any pain or movement at your right / left elbow. Hold this stationary position for __________ seconds.  Gradually release the tension in both   arms. Allow your muscles to relax completely before repeating. Repeat __________ times. Complete this exercise __________ times per day. STRENGTH - Shoulder Flexion, Isometric  With good posture and facing a wall, stand or sit about 4-6 inches away.  Keeping your right / left elbow straight, gently press the top of your fist into the wall. Increase the pressure gradually until you are pressing as hard as you can, without shrugging your shoulder or increasing any shoulder discomfort.  Hold for __________ seconds.  Release the tension slowly. Relax your shoulder muscles completely before you start the next repetition. Repeat __________ times. Complete this exercise __________ times per day.  STRENGTH - Elbow Flexors, Supinated  With good posture, stand or sit on a firm chair without armrests. Allow  your right / left arm to rest at your side with your palm facing forward.  Holding a __________ weight, or gripping a rubber exercise band or tubing,  bring your hand toward your shoulder.  Allow your muscles to control the resistance as your hand returns to your side. Repeat __________ times. Complete this exercise __________ times per day.  STRENGTH - Shoulder Flexion  Stand or sit with good posture. Grasp a __________ weight, or an exercise band or tubing, so that your hand is "thumbs-up," like when you shake hands.  Slowly lift your right / left arm as far as you can, without increasing any shoulder pain. At first, many people can only raise their hand to shoulder height.  Avoid shrugging your right / left shoulder as your arm rises, by keeping your shoulder blade tucked down and toward your mid-back spine.  Hold for __________ seconds. Control the descent of your hand as you slowly return to your starting position. Repeat __________ times. Complete this exercise __________ times per day. Document Released: 04/14/2005 Document Revised: 07/07/2011 Document Reviewed: 07/27/2008 [employer] Patient Information 2015 [employer]. This information is not intended to replace advice given to you by your health care provider. Make sure you discuss any questions you have with your health care provider.  

## 2013-12-02 NOTE — ED Notes (Signed)
Right shoulder pain since last pm. 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

## 2013-12-02 NOTE — ED Provider Notes (Signed)
Medical screening examination/treatment/procedure(s) were performed by non-physician practitioner and as supervising physician I was immediately available for consultation/collaboration.   EKG Interpretation None        Rolan BuccoMelanie Ariela Mochizuki, MD 12/02/13 1137

## 2013-12-02 NOTE — ED Provider Notes (Signed)
CSN: [id number]     Arrival date & time 12/02/13  4098 History  This chart was scribed for non-physician practitioner working with [name], MD by Elveria Rising, ED Scribe. This patient was seen in room TR08C/TR08C and the patient's care was started at 9:52 AM.   No chief complaint on file.    The history is provided by the patient. No language interpreter was used.   HPI Comments: Randy Potter is a 33 y.o. male who presents to the Emergency Department complaining of sharp right shoulder pain, onset one day ago. Patient reports exacerbation with lifting his right arm. 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 Patient currently rates pain at 7/10. He has not taken any medication. Patient denies history of surgeries. Patient is right hand dominant.   Past Medical History  Diagnosis Date  . Depression    Past Surgical History  Procedure Laterality Date  . Knee arthroscopy  09/28/2011    Procedure: ARTHROSCOPY KNEE;  Surgeon: Cammy Copa, MD;  Location: [facility] OR;  Service: Orthopedics;  Laterality: Left;  . Irrigation and debridement knee  09/28/2011    Procedure: IRRIGATION AND DEBRIDEMENT KNEE;  Surgeon: Cammy Copa, MD;  Location: [facility] OR;  Service: Orthopedics;  Laterality: Left;  . Incision and drainage of wound  09/28/2011    Procedure: IRRIGATION AND DEBRIDEMENT WOUND;  Surgeon: Cammy Copa, MD;  Location: [facility] OR;  Service: Orthopedics;  Laterality: Right;  Complex closure of laceration   No family history on file. History  Substance Use Topics  . Smoking status: Current Every Day Smoker -- 0.50 packs/day for 15 years    Types: Cigarettes  . Smokeless tobacco: Not on file  . Alcohol Use: No    Review of Systems  Constitutional: Negative for fever and chills.  Musculoskeletal: Positive for arthralgias.      Allergies  Review of patient's allergies indicates no known allergies.  Home Medications   Prior to Admission medications    Medication Sig Start Date End Date Taking? Authorizing Provider  meloxicam (MOBIC) 7.5 MG tablet Take 2 tablets (15 mg total) by mouth daily. Take daily for 7 days, then daily as needed for pain. 12/02/13   Junius Finner, PA-C  traMADol (ULTRAM) 50 MG tablet Take 1 tablet (50 mg total) by mouth every 6 (six) hours as needed. 12/02/13   Junius Finner, PA-C   BP 123/66  Pulse 76  Temp(Src) 98.3 F (36.8 C) (Oral)  Resp 18  Ht 5\' 7"  (1.702 m)  Wt 215 lb (97.523 kg)  BMI 33.67 kg/m2  SpO2 94%  Physical Exam  Nursing note and vitals reviewed. Constitutional: He is oriented to person, place, and time. He appears well-developed and well-nourished.  HENT:  Head: Normocephalic and atraumatic.  Eyes: EOM are normal.  Neck: Neck supple.  Cardiovascular: Normal rate.   Pulmonary/Chest: Effort normal.  Musculoskeletal: Normal range of motion.  Right shoulder: no edema or deformity. limited ROM with abduction due to pain. Tenderness over bicipital groove. 4/5 strength in right arm compared to left. Sensation in tact. Radial pulses 2+.   Neurological: He is alert and oriented to person, place, and time.  Skin: Skin is warm and dry.  No ecchymosis or erythema.   Psychiatric: He has a normal mood and affect. His behavior is normal.    ED Course  Procedures (including critical care time)  COORDINATION OF CARE: 9:56 AM- Discussed treatment plan with patient at bedside and patient agreed to plan.  Labs Review Labs Reviewed - No data to display  Imaging Review No results found.   EKG Interpretation None      MDM   Final diagnoses:  Biceps tendonitis on right    Pt is a 33yo male c/o right shoulder pain after lifting heavy mattresses at work yesterday. No fall or direct trauma to shoulder. Pt is tender in bicipital groove and has decreased abduction of right shoulder due to pain.  Will tx with tramadol and mobic. Home care instructions provided. Advised to avoid heavy lifting for at  least 3 days, f/u with PCP and Orthopedics if symptoms not improving in 1-2 weeks. Pt verbalized understanding and agreement with tx plan.   I personally performed the services described in this documentation, which was scribed in my presence. The recorded information has been reviewed and is accurate.    Junius Finnerrin O'Malley, PA-C 12/02/13 1006

## 2014-09-24 ENCOUNTER — Emergency Department (HOSPITAL_COMMUNITY)
Admission: EM | Admit: 2014-09-24 | Discharge: 2014-09-24 | Disposition: A | Discharge status: Home / Self Care | Payer: Medicaid Other | Attending: Emergency Medicine | Admitting: Emergency Medicine

## 2014-09-24 ENCOUNTER — Emergency Department (HOSPITAL_COMMUNITY): Payer: Medicaid Other

## 2014-09-24 ENCOUNTER — Encounter (HOSPITAL_COMMUNITY): Payer: Self-pay | Admitting: Family Medicine

## 2014-09-24 DIAGNOSIS — F329 Major depressive disorder, single episode, unspecified: Secondary | ICD-10-CM | POA: Insufficient documentation

## 2014-09-24 DIAGNOSIS — R059 Cough, unspecified: Secondary | ICD-10-CM

## 2014-09-24 DIAGNOSIS — R05 Cough: Secondary | ICD-10-CM | POA: Diagnosis present

## 2014-09-24 DIAGNOSIS — J069 Acute upper respiratory infection, unspecified: Secondary | ICD-10-CM | POA: Insufficient documentation

## 2014-09-24 DIAGNOSIS — M62830 Muscle spasm of back: Secondary | ICD-10-CM | POA: Diagnosis not present

## 2014-09-24 DIAGNOSIS — Z720 Tobacco use: Secondary | ICD-10-CM | POA: Diagnosis not present

## 2014-09-24 MED ORDER — DICLOFENAC POTASSIUM 50 MG PO TABS: 50.0000 mg | ORAL_TABLET | Freq: Three times a day (TID) | ORAL | Status: DC

## 2014-09-24 MED ORDER — BENZONATATE 100 MG PO CAPS: 100.0000 mg | ORAL_CAPSULE | Freq: Three times a day (TID) | ORAL | Status: DC

## 2014-09-24 NOTE — ED Provider Notes (Signed)
CSN: [id number]     Arrival date & time 09/24/14  1533 History  This chart was scribed for Randy FinnerErin O'Malley, PA-C, working with Lorre NickAnthony Allen, MD by Jolene Provostobert Halas, ED Scribe. This patient was seen in room TR11C/TR11C and the patient's care was started at 6:02 PM.    Chief Complaint  Patient presents with  . Back Pain  . URI  . Cough   Patient is a 34 y.o. male presenting with back pain, URI, and cough. The history is provided by the patient. No language interpreter was used.  Back Pain Associated symptoms: no abdominal pain and no fever   URI Presenting symptoms: cough   Presenting symptoms: no fever   Associated symptoms: no wheezing   Cough Associated symptoms: no chills, no fever and no wheezing    HPI Comments: Randy Potter is a 34 y.o. male who presents to the Emergency Department complaining of back pain and a cough for the last three days. Pt states his cough makes his back pain worse. Pain is aching and sore, worse with certain movement, 8/10 at worst.  Pt endorses associated sneezing. Pt states his back has given out twice in the last two weeks. Pt denies fever, CP, nausea, vomiting, hx of back surgeries or asthma. Denies change in bowel or bladder habits. Denies numbness or tingling in arms or legs.    Past Medical History  Diagnosis Date  . Depression    Past Surgical History  Procedure Laterality Date  . Knee arthroscopy  09/28/2011    Procedure: ARTHROSCOPY KNEE;  Surgeon: Cammy CopaGregory Scott Dean, MD;  [facility];  Service: Orthopedics;  Laterality: Left;  . Irrigation and debridement knee  09/28/2011    Procedure: IRRIGATION AND DEBRIDEMENT KNEE;  Surgeon: Cammy CopaGregory Scott Dean, MD;  [facility];  Service: Orthopedics;  Laterality: Left;  . Incision and drainage of wound  09/28/2011    Procedure: IRRIGATION AND DEBRIDEMENT WOUND;  Surgeon: Cammy CopaGregory Scott Dean, MD;  [facility];  Service: Orthopedics;  Laterality: Right;  Complex closure of laceration   History reviewed.  No pertinent family history. History  Substance Use Topics  . Smoking status: Current Every Day Smoker -- 0.50 packs/day for 15 years    Types: Cigarettes  . Smokeless tobacco: Not on file  . Alcohol Use: No    Review of Systems  Constitutional: Negative for fever and chills.  Respiratory: Positive for cough. Negative for wheezing.   Gastrointestinal: Negative for nausea, vomiting and abdominal pain.  Musculoskeletal: Positive for back pain.    Allergies  Review of patient's allergies indicates no known allergies.  Home Medications   Prior to Admission medications   Medication Sig Start Date End Date Taking? Authorizing Provider  benzonatate (TESSALON) 100 MG capsule Take 1 capsule (100 mg total) by mouth every 8 (eight) hours. 09/24/14   Randy FinnerErin O'Malley, PA-C  diclofenac (CATAFLAM) 50 MG tablet Take 1 tablet (50 mg total) by mouth 3 (three) times daily. 09/24/14   Randy FinnerErin O'Malley, PA-C  traMADol (ULTRAM) 50 MG tablet Take 1 tablet (50 mg total) by mouth every 6 (six) hours as needed. 12/02/13   Randy FinnerErin O'Malley, PA-C   BP 134/71 mmHg  Pulse 74  Temp(Src) 98 F (36.7 C) (Oral)  Resp 18  SpO2 100% Physical Exam  Constitutional: He is oriented to person, place, and time. He appears well-developed and well-nourished. No distress.  HENT:  Head: Normocephalic and atraumatic.  Right Ear: Hearing, tympanic membrane, external ear and ear canal  normal.  Left Ear: Hearing, tympanic membrane, external ear and ear canal normal.  Nose: Nose normal.  Mouth/Throat: Uvula is midline, oropharynx is clear and moist and mucous membranes are normal.  Eyes: Pupils are equal, round, and reactive to light.  Neck: Neck supple.  Cardiovascular: Normal rate and normal heart sounds.   No murmur heard. Pulmonary/Chest: Effort normal. No respiratory distress. He has no wheezes. He has no rales. He exhibits no tenderness.  Musculoskeletal: Normal range of motion.  Mild tenderness to left and right thoracic  paraspinal muscles. Full ROM in arms and elgs, Radial pulses 2+. Sensation itact in upper and lower extremeites. 5/5 strength.  Neurological: He is alert and oriented to person, place, and time. Coordination normal.  Skin: Skin is warm and dry. He is not diaphoretic.  Psychiatric: He has a normal mood and affect. His behavior is normal.  Nursing note and vitals reviewed.   ED Course  Procedures  DIAGNOSTIC STUDIES: Oxygen Saturation is 96% on RA, normal by my interpretation.    COORDINATION OF CARE: 6:07 PM Discussed treatment plan with pt at bedside and pt agreed to plan.  Labs Review Labs Reviewed - No data to display  Imaging Review Dg Chest 2 View  09/24/2014   CLINICAL DATA:  Back pain for 3 days  EXAM: CHEST  2 VIEW  COMPARISON:  09/28/2011  FINDINGS: The heart size and mediastinal contours are within normal limits. Both lungs are clear. The visualized skeletal structures are unremarkable.  IMPRESSION: No active cardiopulmonary disease.   Electronically Signed   By: Alcide Clever M.D.   On: 09/24/2014 17:02     EKG Interpretation None      MDM   Final diagnoses:  URI (upper respiratory infection)  Back muscle spasm    Pt is a 33yo male presenting to ED with c/o URI symptoms with associated back spasms. On exam, pt appears well, non-toxic, afebrile. Lungs: CTAB. PERC negative.  Back: tenderness with palpation of muscles. No bony tenderness. No hx of recent injuries. Back pain likely due to muscle strain.  CXR: no active cardiopulmonary disease. No further imaging indicated. Will tx conservatively. Encouraged use of alternating hot and cold packs on back muscles. Rx: tessalon and diclofenac.  Home care instructions provided. Return precautions provided. Pt verbalized understanding and agreement with tx plan.   I personally performed the services described in this documentation, which was scribed in my presence. The recorded information has been reviewed and is  accurate.    Randy Finner, PA-C 09/24/14 1939  Lorre Nick, MD 09/24/14 [phone]

## 2014-09-24 NOTE — ED Notes (Signed)
Pt here for upper back pain, cough, congestion. sts in his back when he moves and coughs and sneezes. sts he has been taking ibuprofen and nyquil.

## 2014-09-24 NOTE — ED Notes (Addendum)
PTreports his upper  back hurts when he coughs. Pt denies any injury.

## 2018-06-25 ENCOUNTER — Emergency Department (HOSPITAL_COMMUNITY)
Admission: EM | Admit: 2018-06-25 | Discharge: 2018-06-25 | Disposition: A | Discharge status: Home / Self Care | Payer: Self-pay | Attending: Emergency Medicine | Admitting: Emergency Medicine

## 2018-06-25 ENCOUNTER — Encounter (HOSPITAL_COMMUNITY): Payer: Self-pay | Admitting: Emergency Medicine

## 2018-06-25 ENCOUNTER — Emergency Department (HOSPITAL_COMMUNITY): Payer: Self-pay

## 2018-06-25 ENCOUNTER — Other Ambulatory Visit: Payer: Self-pay

## 2018-06-25 DIAGNOSIS — F17210 Nicotine dependence, cigarettes, uncomplicated: Secondary | ICD-10-CM | POA: Insufficient documentation

## 2018-06-25 DIAGNOSIS — Z79899 Other long term (current) drug therapy: Secondary | ICD-10-CM | POA: Insufficient documentation

## 2018-06-25 DIAGNOSIS — W268XXA Contact with other sharp object(s), not elsewhere classified, initial encounter: Secondary | ICD-10-CM | POA: Insufficient documentation

## 2018-06-25 DIAGNOSIS — S66801A Unspecified injury of other specified muscles, fascia and tendons at wrist and hand level, right hand, initial encounter: Secondary | ICD-10-CM

## 2018-06-25 DIAGNOSIS — S61216A Laceration without foreign body of right little finger without damage to nail, initial encounter: Secondary | ICD-10-CM | POA: Insufficient documentation

## 2018-06-25 DIAGNOSIS — Y939 Activity, unspecified: Secondary | ICD-10-CM | POA: Insufficient documentation

## 2018-06-25 DIAGNOSIS — Y929 Unspecified place or not applicable: Secondary | ICD-10-CM | POA: Insufficient documentation

## 2018-06-25 DIAGNOSIS — Y999 Unspecified external cause status: Secondary | ICD-10-CM | POA: Insufficient documentation

## 2018-06-25 MED ORDER — BUPIVACAINE HCL 0.25 % IJ SOLN
5.0000 mL | Freq: Once | INTRAMUSCULAR | Status: DC
  Filled 2018-06-25: qty 5

## 2018-06-25 MED ORDER — LIDOCAINE HCL (PF) 1 % IJ SOLN
5.0000 mL | Freq: Once | INTRAMUSCULAR | Status: AC
  Administered 2018-06-25: 5 mL
  Filled 2018-06-25: qty 5

## 2018-06-25 MED ORDER — CEPHALEXIN 250 MG PO CAPS
500.0000 mg | ORAL_CAPSULE | Freq: Once | ORAL | Status: AC
  Administered 2018-06-25: 500 mg via ORAL
  Filled 2018-06-25: qty 2

## 2018-06-25 MED ORDER — LIDOCAINE HCL (PF) 1 % IJ SOLN
5.0000 mL | Freq: Once | INTRAMUSCULAR | Status: AC
  Administered 2018-06-25: 5 mL

## 2018-06-25 MED ORDER — LIDOCAINE HCL (PF) 1 % IJ SOLN
INTRAMUSCULAR | Status: AC
  Administered 2018-06-25: 5 mL
  Filled 2018-06-25: qty 5

## 2018-06-25 MED ORDER — CEPHALEXIN 500 MG PO CAPS: 500.0000 mg | ORAL_CAPSULE | Freq: Four times a day (QID) | ORAL | 0 refills | Status: DC

## 2018-06-25 MED ORDER — BUPIVACAINE HCL (PF) 0.25 % IJ SOLN
5.0000 mL | Freq: Once | INTRAMUSCULAR | Status: AC
  Administered 2018-06-25: 5 mL
  Filled 2018-06-25: qty 30

## 2018-06-25 NOTE — Consult Note (Signed)
Reason for Consult:Right little finger lac Referring Physician: C Tegeler  Randy Potter is an 38 y.o. male.  HPI: Randy Potter was working clearing some scrap and was moving a dryer that had some barbed wire around it. The wire cut his little finger and he came to the ED for evaluation. He c/o localized pain to the area but nothing else. He is RHD.  Past Medical History:  Diagnosis Date  . Depression     Past Surgical History:  Procedure Laterality Date  . INCISION AND DRAINAGE OF WOUND  09/28/2011   Procedure: IRRIGATION AND DEBRIDEMENT WOUND;  Surgeon: Cammy Copa, MD;  Location: [facility] OR;  Service: Orthopedics;  Laterality: Right;  Complex closure of laceration  . IRRIGATION AND DEBRIDEMENT KNEE  09/28/2011   Procedure: IRRIGATION AND DEBRIDEMENT KNEE;  Surgeon: Cammy Copa, MD;  Location: [facility] OR;  Service: Orthopedics;  Laterality: Left;  . KNEE ARTHROSCOPY  09/28/2011   Procedure: ARTHROSCOPY KNEE;  Surgeon: Cammy Copa, MD;  Location: [facility] OR;  Service: Orthopedics;  Laterality: Left;    History reviewed. No pertinent family history.  Social History:  reports that he has been smoking cigarettes. He has a 15.00 pack-year smoking history. He does not have any smokeless tobacco history on file. He reports that he does not drink alcohol or use drugs.  Allergies: No Known Allergies  Medications: I have reviewed the patient's current medications.  No results found for this or any previous visit (from the past 48 hour(s)).  Dg Finger Little Right  Result Date: 06/25/2018 CLINICAL DATA:  Laceration to the right little finger. EXAM: RIGHT LITTLE FINGER 2+V COMPARISON:  None. FINDINGS: Normal alignment of the little finger without fracture or dislocation. No significant arthropathy. There is a soft tissue abnormality along the volar aspect of the finger near the PIP joint. Findings are compatible with the area of laceration. No evidence for a radiopaque foreign body. IMPRESSION: No  acute bone abnormality to the right little finger. No evidence for a radiopaque foreign body. Electronically Signed   By: Richarda Overlie M.D.   On: 06/25/2018 12:01    Review of Systems  Constitutional: Negative for weight loss.  HENT: Negative for ear discharge, ear pain, hearing loss and tinnitus.   Eyes: Negative for blurred vision, double vision, photophobia and pain.  Respiratory: Negative for cough, sputum production and shortness of breath.   Cardiovascular: Negative for chest pain.  Gastrointestinal: Negative for abdominal pain, nausea and vomiting.  Genitourinary: Negative for dysuria, flank pain, frequency and urgency.  Musculoskeletal: Positive for joint pain (Right little finger). Negative for back pain, falls, myalgias and neck pain.  Neurological: Negative for dizziness, tingling, sensory change, focal weakness, loss of consciousness and headaches.  Endo/Heme/Allergies: Does not bruise/bleed easily.  Psychiatric/Behavioral: Negative for depression, memory loss and substance abuse. The patient is not nervous/anxious.    Blood pressure (!) 141/108, pulse (!) 124, temperature 98.7 F (37.1 C), temperature source Oral, resp. rate 16, height 5\' 7"  (1.702 m), weight 86.2 kg, SpO2 97 %. Physical Exam  Constitutional: He appears well-developed and well-nourished. No distress.  HENT:  Head: Normocephalic and atraumatic.  Eyes: Conjunctivae are normal. Right eye exhibits no discharge. Left eye exhibits no discharge. No scleral icterus.  Neck: Normal range of motion.  Cardiovascular: Normal rate and regular rhythm.  Respiratory: Effort normal. No respiratory distress.  Musculoskeletal:     Comments: Right shoulder, elbow, wrist, digits- Transverse laceration on volar little finger at level of PIP  joint, tendon exposed, unable to flex joint, sensation intact, cap refill <2s, no instability, no blocks to motion  Sens  Ax/R/M/U intact  Mot   Ax/ R/ PIN/ M/ AIN/ U intact  Rad 2+    Neurological: He is alert.  Skin: Skin is warm and dry. He is not diaphoretic.  Psychiatric: He has a normal mood and affect. His behavior is normal.      Assessment/Plan: Right little finger laceration with flexor tendon injury -- Plan on I&D, skin repair by EDPA then f/u with Dr. Merlyn Lot on Monday to discuss tendon repair. Dorsal block splint to go home.    Freeman Caldron, PA-C Orthopedic Surgery [phone] 06/25/2018, 2:01 PM

## 2018-06-25 NOTE — ED Provider Notes (Signed)
[facility] EMERGENCY DEPARTMENT Provider Note   CSN: [id number] Arrival date & time: 06/25/18  1039    History   Chief Complaint Chief Complaint  Patient presents with  . Finger Injury    HPI Randy Potter is a 38 y.o. male.     HPI  Patient is a 38 year old male with history of depression presenting for right small finger injury.  Patient reports that he was helping a friend lift a washer and dryer when he got his right small finger caught on a barbed wire fence.  Patient reports that he is having difficulty feeling the ulnar aspect of the right small finger, and he reports that he cannot bend it.  Tetanus shot up-to-date within 2 years.  Patient is right-handed.  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  Past Medical History:  Diagnosis Date  . Depression     There are no active problems to display for this patient.   Past Surgical History:  Procedure Laterality Date  . INCISION AND DRAINAGE OF WOUND  09/28/2011   Procedure: IRRIGATION AND DEBRIDEMENT WOUND;  Surgeon: Cammy Copa, MD;  Location: [facility] OR;  Service: Orthopedics;  Laterality: Right;  Complex closure of laceration  . IRRIGATION AND DEBRIDEMENT KNEE  09/28/2011   Procedure: IRRIGATION AND DEBRIDEMENT KNEE;  Surgeon: Cammy Copa, MD;  Location: [facility] OR;  Service: Orthopedics;  Laterality: Left;  . KNEE ARTHROSCOPY  09/28/2011   Procedure: ARTHROSCOPY KNEE;  Surgeon: Cammy Copa, MD;  Location: [facility] OR;  Service: Orthopedics;  Laterality: Left;        Home Medications    Prior to Admission medications   Medication Sig Start Date End Date Taking? Authorizing Provider  benzonatate (TESSALON) 100 MG capsule Take 1 capsule (100 mg total) by mouth every 8 (eight) hours. 09/24/14   Lurene Shadow, PA-C  diclofenac (CATAFLAM) 50 MG tablet Take 1 tablet (50 mg total) by mouth 3 (three) times daily. 09/24/14   Lurene Shadow, PA-C  traMADol  (ULTRAM) 50 MG tablet Take 1 tablet (50 mg total) by mouth every 6 (six) hours as needed. 12/02/13   Lurene Shadow, PA-C    Family History History reviewed. No pertinent family history.  Social History Social History   Tobacco Use  . Smoking status: Current Every Day Smoker    Packs/day: 1.00    Years: 15.00    Pack years: 15.00    Types: Cigarettes  Substance Use Topics  . Alcohol use: No    Alcohol/week: 3.0 standard drinks    Types: 3 Shots of liquor per week  . Drug use: No    Frequency: 3.0 times per week    Types: Marijuana     Allergies   Patient has no known allergies.   Review of Systems Review of Systems  Musculoskeletal: Positive for arthralgias.  Skin: Positive for wound.  Neurological: Positive for numbness. Negative for weakness.     Physical Exam Updated Vital Signs BP (!) 141/108 (BP Location: Right Arm)   Pulse (!) 124   Temp 98.7 F (37.1 C) (Oral)   Resp 16   Ht 5\' 7"  (1.702 m)   Wt 86.2 kg   SpO2 97%   BMI 29.76 kg/m   Physical Exam Vitals signs and nursing note reviewed.  Constitutional:      General: He is not in acute distress.    Appearance: He is well-developed. He is not diaphoretic.  Comments: Sitting comfortably in bed.  HENT:     Head: Normocephalic and atraumatic.  Eyes:     General:        Right eye: No discharge.        Left eye: No discharge.     Conjunctiva/sclera: Conjunctivae normal.     Comments: EOMs normal to gross examination.  Neck:     Musculoskeletal: Normal range of motion.  Cardiovascular:     Rate and Rhythm: Normal rate and regular rhythm.     Comments: Intact, 2+ right radial and ulnar pulse. Abdominal:     General: There is no distension.  Musculoskeletal: Normal range of motion.        General: Signs of injury present.     Comments: See clinical photo for details.  Patient has a laceration at level of PIP joint of the right small finger.  There is tendon exposure, but no apparent tendon  laceration.  No varus or valgus laxity.  Patient is unable to flex the finger at both PIP and DIP joint. Nerve testing reveals decreased sensation along the ulnar digital nerve distribution.  Radial digital nerve distribution intact.  Skin:    General: Skin is warm and dry.  Neurological:     Mental Status: He is alert.     Comments: Cranial nerves intact to gross observation. Patient moves extremities without difficulty.  Psychiatric:        Behavior: Behavior normal.        Thought Content: Thought content normal.        Judgment: Judgment normal.        ED Treatments / Results  Labs (all labs ordered are listed, but only abnormal results are displayed) Labs Reviewed - No data to display  EKG None  Radiology No results found.  Procedures .Marland KitchenLaceration Repair Date/Time: 06/25/2018 4:02 PM Performed by: Elisha Ponder, PA-C Authorized by: Elisha Ponder, PA-C   Consent:    Consent obtained:  Verbal   Consent given by:  Patient   Risks discussed:  Pain and need for additional repair Anesthesia (see MAR for exact dosages):    Anesthesia method:  Nerve block   Block needle gauge:  25 G   Block anesthetic:  Lidocaine 1% w/o epi and bupivacaine 0.25% w/o epi   Block injection procedure:  Anatomic landmarks identified, incremental injection, introduced needle, negative aspiration for blood and anatomic landmarks palpated   Block outcome:  Anesthesia achieved Laceration details:    Location:  Finger   Finger location:  R small finger   Length (cm):  3 Repair type:    Repair type:  Simple Pre-procedure details:    Preparation:  Patient was prepped and draped in usual sterile fashion Exploration:    Hemostasis achieved with:  Direct pressure   Wound exploration: wound explored through full range of motion     Wound extent: nerve damage and tendon damage     Tendon damage location:  Upper extremity   Upper extremity tendon damage location:  Finger flexor   Tendon  repair plan:  Refer for evaluation   Contaminated: no   Treatment:    Area cleansed with:  Betadine and saline   Amount of cleaning:  Extensive   Irrigation solution:  Sterile saline   Irrigation volume:  1000   Irrigation method:  Pressure wash and syringe Skin repair:    Repair method:  Sutures   Suture size:  4-0   Wound skin closure material used: Vicryl  rapide.   Suture technique:  Simple interrupted   Number of sutures:  4 Approximation:    Approximation:  Close Post-procedure details:    Dressing:  Splint for protection and non-adherent dressing   Patient tolerance of procedure:  Tolerated well, no immediate complications   (including critical care time)  Medications Ordered in ED Medications - No data to display   Initial Impression / Assessment and Plan / ED Course  I have reviewed the triage vital signs and the nursing notes.  Pertinent labs & imaging results that were available during my care of the patient were reviewed by me and considered in my medical decision making (see chart for details).  Clinical Course as of Jun 25 1598  Fri Jun 25, 2018  1235 Spoke with Earney Hamburg, PA-C who will discuss case with Dr .Merlyn Lot.    [AM]    Clinical Course User Index [AM] Elisha Ponder, PA-C       Patient nontoxic-appearing and in no acute distress.  Patient with a laceration at the level of the PIP joint of the right small finger.  There appears to be a possible ulnar digital nerve injury.  Tendon is visualized, and appears lacerated.  Will consult hand surgery.  Radiograph shows no skeletal involvement.  Patient evaluated by Earney Hamburg of orthopedics at bedside for tendon injury and ulnar digital nerve injury.  Per his phone consultation with Dr. Merlyn Lot, patient is to have primary closure of the skin today and follow-up in 72 hours for an office repair.   Wound was cleansed with 1 L of normal saline and closed primarily with Vicryl Rapide.  Anticipate  removal of sutures in 72 hours for repair of tendon and secondary closure.  Patient was placed on Keflex.  Return precautions given for any increasing swelling, erythema, purulent drainage in the interim.  Patient is in understanding and agrees with the plan of care.  Final Clinical Impressions(s) / ED Diagnoses   Final diagnoses:  Laceration of right little finger without foreign body, nail damage status unspecified, initial encounter  Injury of flexor tendon of right hand, initial encounter    ED Discharge Orders    None       Delia Chimes 06/25/18 1604    Tegeler, Canary Brim, MD 06/25/18 [phone]

## 2018-06-25 NOTE — Discharge Instructions (Addendum)
Please see the information and instructions below regarding your visit.  Your diagnoses today include:  1. Laceration of right little finger without foreign body with damage to nail, initial encounter     Tests performed today include: X-ray of the affected area that did not show any foreign bodies or broken bones Vital signs. See below for your results today.   Medications prescribed:   Take any prescribed medications only as directed.  Ibuprofen alternating with Tylenol for pain.   Please take all of your antibiotics until finished.   You may develop abdominal discomfort or nausea from the antibiotic. If this occurs, you may take it with food. Some patients also get diarrhea with antibiotics. You may help offset this with probiotics which you can buy or get in yogurt. Do not eat or take the probiotics until 2 hours after your antibiotic. Some women develop vaginal yeast infections after antibiotics. If you develop unusual vaginal discharge after being on this medication, please see your primary care provider.   Some people develop allergies to antibiotics. Symptoms of antibiotic allergy can be mild and include a flat rash and itching. They can also be more serious and include:  ?Hives - Hives are raised, red patches of skin that are usually very itchy.  ?Lip or tongue swelling  ?Trouble swallowing or breathing  ?Blistering of the skin or mouth.  If you have any of these serious symptoms, please seek emergency medical care immediately.  Home care instructions:  Follow any educational materials and wound care instructions contained in this packet.   Keep affected area above the level of your heart when possible to minimize swelling. Wash area gently twice a day with warm soapy water. Do not apply alcohol or hydrogen peroxide directly over a wound. Cover the area if it is draining or weeping. Keep the bandage in place for 24 hours and refrain from getting the wound wet for 24 hours.  After that, you may get the area wet, but please ensure that you dry it completely afterwards.  Keep the splint on until you see Dr. Merlyn Lot Monday.  Please refrain from soaking sutures for long periods of time, or swimming in chlorinated water   You may apply antibiotic ointment such as Bacitracin or Neosporin.  Follow-up instructions: Please follow-up with Dr. Merlyn Lot on Monday, 06-28-2018.  Call first thing in the morning at 9 AM to find out when they want to in the clinic.  You will have an in clinic repair that day of the wound.  Return instructions:  Return to the Emergency Department if you have: Fever Worsening pain Worsening swelling of the wound Pus draining from the wound Redness of the skin that moves away from the wound, especially if it streaks away from the affected area  Any other emergent concerns  Your vital signs today were: BP (!) 151/99    Pulse 96    Temp 98.7 F (37.1 C) (Oral)    Resp 16    Ht 5\' 7"  (1.702 m)    Wt 86.2 kg    SpO2 97%    BMI 29.76 kg/m  If your blood pressure (BP) was elevated on multiple readings during this visit above 130 for the top number or above 80 for the bottom number, please have this repeated by your primary care provider within one month. --------------  Thank you for allowing [place] to participate in your care today! It was a pleasure taking care of you.

## 2018-06-25 NOTE — ED Notes (Signed)
Patient given water per request.

## 2018-06-25 NOTE — ED Triage Notes (Signed)
Pt lifting a dryer and had some barbed fence near it and while lifting he accidentally cut his right pinky finger.  Bleeding is controlled pt states that it is cut to the bone. NAD noted at this time

## 2018-07-02 ENCOUNTER — Other Ambulatory Visit: Payer: Self-pay | Admitting: Orthopedic Surgery

## 2018-07-05 ENCOUNTER — Other Ambulatory Visit: Payer: Self-pay

## 2018-07-05 ENCOUNTER — Encounter (HOSPITAL_BASED_OUTPATIENT_CLINIC_OR_DEPARTMENT_OTHER): Payer: Self-pay | Admitting: *Deleted

## 2018-07-08 ENCOUNTER — Encounter (HOSPITAL_BASED_OUTPATIENT_CLINIC_OR_DEPARTMENT_OTHER)
Admission: RE | Disposition: A | Discharge status: Home / Self Care | Payer: Self-pay | Source: Home / Self Care | Attending: Orthopedic Surgery

## 2018-07-08 ENCOUNTER — Ambulatory Visit (HOSPITAL_BASED_OUTPATIENT_CLINIC_OR_DEPARTMENT_OTHER): Payer: Self-pay | Admitting: Anesthesiology

## 2018-07-08 ENCOUNTER — Other Ambulatory Visit: Payer: Self-pay

## 2018-07-08 ENCOUNTER — Ambulatory Visit (HOSPITAL_BASED_OUTPATIENT_CLINIC_OR_DEPARTMENT_OTHER)
Admission: RE | Admit: 2018-07-08 | Discharge: 2018-07-08 | Disposition: A | Discharge status: Home / Self Care | Payer: Self-pay | Attending: Orthopedic Surgery | Admitting: Orthopedic Surgery

## 2018-07-08 ENCOUNTER — Encounter (HOSPITAL_BASED_OUTPATIENT_CLINIC_OR_DEPARTMENT_OTHER): Payer: Self-pay | Admitting: *Deleted

## 2018-07-08 DIAGNOSIS — W268XXA Contact with other sharp object(s), not elsewhere classified, initial encounter: Secondary | ICD-10-CM | POA: Insufficient documentation

## 2018-07-08 DIAGNOSIS — S61216A Laceration without foreign body of right little finger without damage to nail, initial encounter: Secondary | ICD-10-CM | POA: Insufficient documentation

## 2018-07-08 DIAGNOSIS — M1990 Unspecified osteoarthritis, unspecified site: Secondary | ICD-10-CM | POA: Insufficient documentation

## 2018-07-08 DIAGNOSIS — S64496A Injury of digital nerve of right little finger, initial encounter: Secondary | ICD-10-CM | POA: Insufficient documentation

## 2018-07-08 DIAGNOSIS — S65011A Laceration of ulnar artery at wrist and hand level of right arm, initial encounter: Secondary | ICD-10-CM | POA: Insufficient documentation

## 2018-07-08 DIAGNOSIS — F17210 Nicotine dependence, cigarettes, uncomplicated: Secondary | ICD-10-CM | POA: Insufficient documentation

## 2018-07-08 DIAGNOSIS — S66126A Laceration of flexor muscle, fascia and tendon of right little finger at wrist and hand level, initial encounter: Secondary | ICD-10-CM | POA: Insufficient documentation

## 2018-07-08 HISTORY — PX: NERVE, TENDON AND ARTERY REPAIR: SHX5695

## 2018-07-08 HISTORY — DX: Anxiety disorder, unspecified: F41.9

## 2018-07-08 HISTORY — DX: Unspecified osteoarthritis, unspecified site: M19.90

## 2018-07-08 SURGERY — NERVE, TENDON AND ARTERY REPAIR
Anesthesia: Regional | Site: Finger | Laterality: Right

## 2018-07-08 MED ORDER — BUPIVACAINE HCL (PF) 0.25 % IJ SOLN
INTRAMUSCULAR | Status: AC
  Filled 2018-07-08: qty 30

## 2018-07-08 MED ORDER — LACTATED RINGERS IV SOLN
INTRAVENOUS | Status: DC
  Administered 2018-07-08: 14:00:00 via INTRAVENOUS

## 2018-07-08 MED ORDER — OXYCODONE HCL 5 MG/5ML PO SOLN: 5.0000 mg | Freq: Once | ORAL | Status: DC | PRN

## 2018-07-08 MED ORDER — MIDAZOLAM HCL 2 MG/2ML IJ SOLN
INTRAMUSCULAR | Status: AC
  Filled 2018-07-08: qty 2

## 2018-07-08 MED ORDER — OXYCODONE HCL 5 MG PO TABS: 5.0000 mg | ORAL_TABLET | Freq: Once | ORAL | Status: DC | PRN

## 2018-07-08 MED ORDER — PROMETHAZINE HCL 25 MG/ML IJ SOLN: 6.2500 mg | INTRAMUSCULAR | Status: DC | PRN

## 2018-07-08 MED ORDER — CEFAZOLIN SODIUM-DEXTROSE 2-4 GM/100ML-% IV SOLN
2.0000 g | INTRAVENOUS | Status: AC
  Administered 2018-07-08: 2 g via INTRAVENOUS

## 2018-07-08 MED ORDER — PROPOFOL 10 MG/ML IV BOLUS
INTRAVENOUS | Status: AC
  Filled 2018-07-08: qty 20

## 2018-07-08 MED ORDER — MIDAZOLAM HCL 2 MG/2ML IJ SOLN
1.0000 mg | INTRAMUSCULAR | Status: DC | PRN
  Administered 2018-07-08 (×2): 2 mg via INTRAVENOUS

## 2018-07-08 MED ORDER — SCOPOLAMINE 1 MG/3DAYS TD PT72: 1.0000 | MEDICATED_PATCH | Freq: Once | TRANSDERMAL | Status: DC | PRN

## 2018-07-08 MED ORDER — 0.9 % SODIUM CHLORIDE (POUR BTL) OPTIME
TOPICAL | Status: DC | PRN
  Administered 2018-07-08: 100 mL

## 2018-07-08 MED ORDER — OXYCODONE-ACETAMINOPHEN 5-325 MG PO TABS: ORAL_TABLET | ORAL | 0 refills | Status: DC

## 2018-07-08 MED ORDER — PROPOFOL 500 MG/50ML IV EMUL
INTRAVENOUS | Status: DC | PRN
  Administered 2018-07-08: 75 ug/kg/min via INTRAVENOUS

## 2018-07-08 MED ORDER — CEFAZOLIN SODIUM-DEXTROSE 2-4 GM/100ML-% IV SOLN
INTRAVENOUS | Status: AC
  Filled 2018-07-08: qty 100

## 2018-07-08 MED ORDER — ONDANSETRON HCL 4 MG/2ML IJ SOLN: 4.0000 mg | Freq: Once | INTRAMUSCULAR | Status: DC | PRN

## 2018-07-08 MED ORDER — FENTANYL CITRATE (PF) 100 MCG/2ML IJ SOLN
INTRAMUSCULAR | Status: AC
  Filled 2018-07-08: qty 2

## 2018-07-08 MED ORDER — FENTANYL CITRATE (PF) 100 MCG/2ML IJ SOLN
50.0000 ug | INTRAMUSCULAR | Status: DC | PRN
  Administered 2018-07-08: 100 ug via INTRAVENOUS

## 2018-07-08 MED ORDER — ROPIVACAINE HCL 5 MG/ML IJ SOLN
INTRAMUSCULAR | Status: DC | PRN
  Administered 2018-07-08: 30 mL via PERINEURAL

## 2018-07-08 MED ORDER — ONDANSETRON HCL 4 MG/2ML IJ SOLN
INTRAMUSCULAR | Status: AC
  Filled 2018-07-08: qty 2

## 2018-07-08 MED ORDER — FENTANYL CITRATE (PF) 100 MCG/2ML IJ SOLN: 25.0000 ug | INTRAMUSCULAR | Status: DC | PRN

## 2018-07-08 MED ORDER — ONDANSETRON HCL 4 MG/2ML IJ SOLN
INTRAMUSCULAR | Status: DC | PRN
  Administered 2018-07-08: 4 mg via INTRAVENOUS

## 2018-07-08 MED ORDER — CHLORHEXIDINE GLUCONATE 4 % EX LIQD: 60.0000 mL | Freq: Once | CUTANEOUS | Status: DC

## 2018-07-08 SURGICAL SUPPLY — 66 items
BAG DECANTER FOR FLEXI CONT (MISCELLANEOUS) IMPLANT
BANDAGE ACE 3X5.8 VEL STRL LF (GAUZE/BANDAGES/DRESSINGS) ×3 IMPLANT
BLADE MINI RND TIP GREEN BEAV (BLADE) ×3 IMPLANT
BLADE SURG 15 STRL LF DISP TIS (BLADE) ×2 IMPLANT
BLADE SURG 15 STRL SS (BLADE) ×4
BNDG ESMARK 4X9 LF (GAUZE/BANDAGES/DRESSINGS) ×3 IMPLANT
BNDG GAUZE ELAST 4 BULKY (GAUZE/BANDAGES/DRESSINGS) ×3 IMPLANT
BRUSH SCRUB EZ PLAIN DRY (MISCELLANEOUS) IMPLANT
CATH ROBINSON RED A/P 10FR (CATHETERS) IMPLANT
CHLORAPREP W/TINT 26 (MISCELLANEOUS) ×3 IMPLANT
CORD BIPOLAR FORCEPS 12FT (ELECTRODE) ×3 IMPLANT
COVER BACK TABLE 60X90IN (DRAPES) ×3 IMPLANT
COVER MAYO STAND STRL (DRAPES) ×3 IMPLANT
COVER WAND RF STERILE (DRAPES) IMPLANT
CUFF TOURN SGL QUICK 18X4 (TOURNIQUET CUFF) IMPLANT
DECANTER SPIKE VIAL GLASS SM (MISCELLANEOUS) ×3 IMPLANT
DRAPE EXTREMITY T 121X128X90 (DISPOSABLE) ×3 IMPLANT
DRAPE SURG 17X23 STRL (DRAPES) ×3 IMPLANT
GAUZE SPONGE 4X4 12PLY STRL (GAUZE/BANDAGES/DRESSINGS) ×3 IMPLANT
GAUZE XEROFORM 1X8 LF (GAUZE/BANDAGES/DRESSINGS) ×3 IMPLANT
GLOVE BIO SURGEON STRL SZ7.5 (GLOVE) ×3 IMPLANT
GLOVE BIOGEL M STRL SZ7.5 (GLOVE) ×3 IMPLANT
GLOVE BIOGEL PI IND STRL 7.0 (GLOVE) ×1 IMPLANT
GLOVE BIOGEL PI IND STRL 8 (GLOVE) ×2 IMPLANT
GLOVE BIOGEL PI IND STRL 8.5 (GLOVE) ×1 IMPLANT
GLOVE BIOGEL PI INDICATOR 7.0 (GLOVE) ×2
GLOVE BIOGEL PI INDICATOR 8 (GLOVE) ×4
GLOVE BIOGEL PI INDICATOR 8.5 (GLOVE) ×2
GLOVE SURG ORTHO 8.0 STRL STRW (GLOVE) ×3 IMPLANT
GOWN STRL REUS W/ TWL LRG LVL3 (GOWN DISPOSABLE) ×1 IMPLANT
GOWN STRL REUS W/ TWL XL LVL3 (GOWN DISPOSABLE) ×1 IMPLANT
GOWN STRL REUS W/TWL LRG LVL3 (GOWN DISPOSABLE) ×2
GOWN STRL REUS W/TWL XL LVL3 (GOWN DISPOSABLE) ×8 IMPLANT
LOOP VESSEL MAXI BLUE (MISCELLANEOUS) IMPLANT
NDL SAFETY ECLIPSE 18X1.5 (NEEDLE) IMPLANT
NEEDLE HYPO 18GX1.5 SHARP (NEEDLE)
NEEDLE HYPO 25X1 1.5 SAFETY (NEEDLE) ×3 IMPLANT
NS IRRIG 1000ML POUR BTL (IV SOLUTION) ×3 IMPLANT
PACK BASIN DAY SURGERY FS (CUSTOM PROCEDURE TRAY) ×3 IMPLANT
PAD CAST 3X4 CTTN HI CHSV (CAST SUPPLIES) ×1 IMPLANT
PAD CAST 4YDX4 CTTN HI CHSV (CAST SUPPLIES) IMPLANT
PADDING CAST ABS 4INX4YD NS (CAST SUPPLIES) ×2
PADDING CAST ABS COTTON 4X4 ST (CAST SUPPLIES) ×1 IMPLANT
PADDING CAST COTTON 3X4 STRL (CAST SUPPLIES) ×2
PADDING CAST COTTON 4X4 STRL (CAST SUPPLIES)
SLEEVE SCD COMPRESS KNEE MED (MISCELLANEOUS) ×3 IMPLANT
SLING ARM FOAM STRAP LRG (SOFTGOODS) ×3 IMPLANT
SPEAR EYE SURG WECK-CEL (MISCELLANEOUS) ×3 IMPLANT
SPLINT PLASTER CAST XFAST 3X15 (CAST SUPPLIES) IMPLANT
SPLINT PLASTER XTRA FASTSET 3X (CAST SUPPLIES)
STOCKINETTE 4X48 STRL (DRAPES) ×3 IMPLANT
SUT ETHIBOND 3-0 V-5 (SUTURE) IMPLANT
SUT ETHILON 4 0 PS 2 18 (SUTURE) ×3 IMPLANT
SUT FIBERWIRE 4-0 18 TAPR NDL (SUTURE)
SUT MERSILENE 4 0 P 3 (SUTURE) ×3 IMPLANT
SUT NYLON 9 0 VRM6 (SUTURE) ×3 IMPLANT
SUT PROLENE 6 0 P 1 18 (SUTURE) ×3 IMPLANT
SUT SILK 4 0 PS 2 (SUTURE) ×3 IMPLANT
SUT SUPRAMID 4-0 (SUTURE) ×3 IMPLANT
SUT VICRYL 4-0 PS2 18IN ABS (SUTURE) IMPLANT
SUTURE FIBERWR 4-0 18 TAPR NDL (SUTURE) IMPLANT
SYR BULB 3OZ (MISCELLANEOUS) ×3 IMPLANT
SYR CONTROL 10ML LL (SYRINGE) IMPLANT
TOWEL GREEN STERILE FF (TOWEL DISPOSABLE) ×6 IMPLANT
TRAY DSU PREP LF (CUSTOM PROCEDURE TRAY) IMPLANT
UNDERPAD 30X30 (UNDERPADS AND DIAPERS) ×3 IMPLANT

## 2018-07-08 NOTE — Op Note (Addendum)
NAME: Randy Potter MEDICAL RECORD NO: [id number] DATE OF BIRTH: [birth year] FACILITY: [facility] LOCATION: [facility] PHYSICIAN: Tami Ribas, MD   OPERATIVE REPORT   DATE OF PROCEDURE: 07/08/18    PREOPERATIVE DIAGNOSIS:   Right small finger laceration with tendon artery nerve laceration   POSTOPERATIVE DIAGNOSIS:   Right small finger FDP and FDS zone 2 lacerations and ulnar digital nerve and artery lacerations   PROCEDURE:   1.  Right small finger repair of FDP in zone 2 2.  Right small finger repair of FDS in zone 2 3.  Right small finger repair of ulnar digital artery under microscope 4.  Right small finger repair of ulnar digital nerve under microscope   SURGEON:  Betha Loa, M.D.   ASSISTANT: Cindee Salt, MD   ANESTHESIA:  Regional with sedation   INTRAVENOUS FLUIDS:  Per anesthesia flow sheet.   ESTIMATED BLOOD LOSS:  Minimal.   COMPLICATIONS:  None.   SPECIMENS:  none   TOURNIQUET TIME:    Total Tourniquet Time Documented: Upper Arm (Right) - 76 minutes Total: Upper Arm (Right) - 76 minutes    DISPOSITION:  Stable to PACU.   INDICATIONS: 38 year old male states he sustained a laceration to the right small finger on barbed wire approximately 2 weeks ago.  He is unable to flex the finger.  He notes decreased sensation on the ulnar side.  He wishes to proceed with operative exploration and repair as necessary. Risks, benefits and alternatives of surgery were discussed including the risks of blood loss, infection, damage to nerves, vessels, tendons, ligaments, bone for surgery, need for additional surgery, complications with wound healing, continued pain, stiffness.  He voiced understanding of these risks and elected to proceed.  OPERATIVE COURSE:  After being identified preoperatively by myself,  the patient and I agreed on the procedure and site of the procedure.  The surgical site was marked.  Surgical consent had been signed. He was given IV  antibiotics as preoperative antibiotic prophylaxis. He was transferred to the operating room and placed on the operating table in supine position with the Right upper extremity on an arm board.  Sedation was induced by the anesthesiologist. A regional block had been performed by anesthesia in preoperative holding.   Right upper extremity was prepped and draped in normal sterile orthopedic fashion.  A surgical pause was performed between the surgeons, anesthesia, and operating room staff and all were in agreement as to the patient, procedure, and site of procedure.  Tourniquet at the proximal aspect of the extremity was inflated to 250 mmHg after exsanguination of the arm with an Esmarch bandage.    The sutures had been removed from the wound.  The wound was opened.  It was extended proximally to aid in visualization in a Bruner technique.  The ulnar digital nerve and artery were identified and had been lacerated.  The flexor sheath had a laceration and the tendons had been lacerated 100%.  The wound was extended distally in a [place] fashion to aid in visualization.  The radial digital nerve and artery were identified and were intact.  The proximal tendon stumps were able to be retrieved.  One limb of the FDS on the ulnar side was removed to allow better space in the sheath.  The radial limb of the FDS tendon was repaired to its stump at the middle phalanx with a 4-0 Mersilene suture in a figure-of-eight fashion.  The FDP tendon was repaired with a 4 oh  looped Supramid suture in a modified Kessler technique.  This repair ended up being distal to the A4 pulley where the stump of the tendon distally was.  Good reapproximation of the tendon was achieved.  A running epitendinous 6-0 Prolene suture was placed as well.  Good tendon and re-apposition was obtained.  The distal aspect of the A4 pulley was vented to provide better chance of excursion of the tendon through the sheath.  The microscope was brought in.  It was  used to perform micro dissection of the ulnar digital nerve and artery.  The arterial ends were freshened to get good lumen.  A 9-0 nylon suture was used in a circumferential interrupted technique to reapproximate the nerve ends which had been freshened as well.  A 9-0 nylon suture was then used in an interrupted circumferential fashion to reapproximate the arterial ends.  Good reapproximation was obtained.  The wound was copiously irrigated with sterile saline.  Was then closed with 4-0 nylon in a horizontal mattress fashion.  It was dressed with sterile Xeroform 4 x 4's and wrapped with a Kerlix bandage.  A dorsal blocking splint was placed with the wrist approximately 30 degrees flexed and the MPs flexed the IP is extended.  This was wrapped with Kerlix and Ace bandage.  The tourniquet was deflated at 76 minutes.  Fingertips were pink with brisk capillary refill after deflation of tourniquet.  The operative  drapes were broken down.  The patient was awoken from anesthesia safely.  He was transferred back to the stretcher and taken to PACU in stable condition.  I will see him back in the office in 1 week for postoperative followup.  I will give him a prescription for Percocet 5/325 1-2 tabs PO q6 hours prn pain, dispense # 30.   Betha Loa, MD Electronically signed, 07/08/18

## 2018-07-08 NOTE — H&P (Signed)
  Randy Potter is an 38 y.o. male.   Chief Complaint: right small finger laceration HPI: 38 yo male states he lacerated right small finger on barbed wire fence 2 weeks ago.  Seen in ED where wound cleaned and sutured.  Followed up in office.  Unable to flex finger.  He wishes to proceed with operative exploration and repair.  Allergies: No Known Allergies  Past Medical History:  Diagnosis Date  . Anxiety   . Arthritis   . Depression     Past Surgical History:  Procedure Laterality Date  . INCISION AND DRAINAGE OF WOUND  09/28/2011   Procedure: IRRIGATION AND DEBRIDEMENT WOUND;  Surgeon: Cammy Copa, MD;  Location: [facility] OR;  Service: Orthopedics;  Laterality: Right;  Complex closure of laceration  . IRRIGATION AND DEBRIDEMENT KNEE  09/28/2011   Procedure: IRRIGATION AND DEBRIDEMENT KNEE;  Surgeon: Cammy Copa, MD;  Location: [facility] OR;  Service: Orthopedics;  Laterality: Left;  . KNEE ARTHROSCOPY  09/28/2011   Procedure: ARTHROSCOPY KNEE;  Surgeon: Cammy Copa, MD;  Location: [facility] OR;  Service: Orthopedics;  Laterality: Left;    Family History: History reviewed. No pertinent family history.  Social History:   reports that he has been smoking cigarettes. He has a 15.00 pack-year smoking history. He has never used smokeless tobacco. He reports that he does not drink alcohol or use drugs.  Medications: No medications prior to admission.    No results found for this or any previous visit (from the past 48 hour(s)).  No results found.   A comprehensive review of systems was negative.  Height 5\' 7"  (1.702 m), weight 88.5 kg.  General appearance: alert, cooperative and appears stated age Head: Normocephalic, without obvious abnormality, atraumatic Neck: supple, symmetrical, trachea midline Cardio: regular rate and rhythm Resp: clear to auscultation bilaterally Extremities: Intact sensation and capillary refill all digits except for decreased sensation in ulnar side of  small finger.  +epl/fpl/io.  Laceration over proximal phalanx. Pulses: 2+ and symmetric Skin: Skin color, texture, turgor normal. No rashes or lesions Neurologic: Grossly normal Incision/Wound: as above  Assessment/Plan Right small finger laceration with possible tendon/artery/nerve injury.  Plan operative exploration with repair as necessary.  Non operative and operative treatment options have been discussed with the patient and patient wishes to proceed with operative treatment. Risks, benefits, and alternatives of surgery have been discussed and the patient agrees with the plan of care.   Betha Loa 07/08/2018, 10:36 AM

## 2018-07-08 NOTE — Discharge Instructions (Addendum)
° °  ° ° ° °Hand Center Instructions °Hand Surgery ° °Wound Care: °Keep your hand elevated above the level of your heart.  Do not allow it to dangle by your side.  Keep the dressing dry and do not remove it unless your doctor advises you to do so.  He will usually change it at the time of your post-op visit.  Moving your fingers is advised to stimulate circulation but will depend on the site of your surgery.  If you have a splint applied, your doctor will advise you regarding movement. ° °Activity: °Do not drive or operate machinery today.  Rest today and then you may return to your normal activity and work as indicated by your physician. ° °Diet:  °Drink liquids today or eat a light diet.  You may resume a regular diet tomorrow.   ° °General expectations: °Pain for two to three days. °Fingers may become slightly swollen. ° °Call your doctor if any of the following occur: °Severe pain not relieved by pain medication. °Elevated temperature. °Dressing soaked with blood. °Inability to move fingers. °White or bluish color to fingers. ° ° °Regional Anesthesia Blocks ° °1. Numbness or the inability to move the "blocked" extremity may last from 3-48 hours after placement. The length of time depends on the medication injected and your individual response to the medication. If the numbness is not going away after 48 hours, call your surgeon. ° °2. The extremity that is blocked will need to be protected until the numbness is gone and the  Strength has returned. Because you cannot feel it, you will need to take extra care to avoid injury. Because it may be weak, you may have difficulty moving it or using it. You may not know what position it is in without looking at it while the block is in effect. ° °3. For blocks in the legs and feet, returning to weight bearing and walking needs to be done carefully. You will need to wait until the numbness is entirely gone and the strength has returned. You should be able to move your leg  and foot normally before you try and bear weight or walk. You will need someone to be with you when you first try to ensure you do not fall and possibly risk injury. ° °4. Bruising and tenderness at the needle site are common side effects and will resolve in a few days. ° °5. Persistent numbness or new problems with movement should be communicated to the surgeon or the [facility] ([phone])/ [facility] ([phone]). ° ° ° °Post Anesthesia Home Care Instructions ° °Activity: °Get plenty of rest for the remainder of the day. A responsible individual must stay with you for 24 hours following the procedure.  °For the next 24 hours, DO NOT: °-Drive a car °-Operate machinery °-Drink alcoholic beverages °-Take any medication unless instructed by your physician °-Make any legal decisions or sign important papers. ° °Meals: °Start with liquid foods such as gelatin or soup. Progress to regular foods as tolerated. Avoid greasy, spicy, heavy foods. If nausea and/or vomiting occur, drink only clear liquids until the nausea and/or vomiting subsides. Call your physician if vomiting continues. ° °Special Instructions/Symptoms: °Your throat may feel dry or sore from the anesthesia or the breathing tube placed in your throat during surgery. If this causes discomfort, gargle with warm salt water. The discomfort should disappear within 24 hours. ° °If you had a scopolamine patch placed behind your ear for   the management of post- operative nausea and/or vomiting: ° °1. The medication in the patch is effective for 72 hours, after which it should be removed.  Wrap patch in a tissue and discard in the trash. Wash hands thoroughly with soap and water. °2. You may remove the patch earlier than 72 hours if you experience unpleasant side effects which may include dry mouth, dizziness or visual disturbances. °3. Avoid touching the patch. Wash your hands with soap and water after contact with the patch. °  ° ° °

## 2018-07-08 NOTE — Transfer of Care (Signed)
Immediate Anesthesia Transfer of Care Note  Patient: Randy Potter  Procedure(s) Performed: RIGHT SMALL FINGER WOUND EXPLORATION WITH REPAIR OF NERVE, TENDON AND ARTERY (Right Finger)  Patient Location: PACU  Anesthesia Type:MAC combined with regional for post-op pain  Level of Consciousness: sedated and patient cooperative  Airway & Oxygen Therapy: Patient Spontanous Breathing and Patient connected to face mask oxygen  Post-op Assessment: Report given to RN and Post -op Vital signs reviewed and stable  Post vital signs: Reviewed and stable  Last Vitals:  Vitals Value Taken Time  BP    Temp    Pulse 77 07/08/2018  5:00 PM  Resp    SpO2 95 % 07/08/2018  5:00 PM  Vitals shown include unvalidated device data.  Last Pain:  Vitals:   07/08/18 1326  TempSrc: Oral  PainSc: 0-No pain         Complications: No apparent anesthesia complications

## 2018-07-08 NOTE — Progress Notes (Signed)
Assisted Dr. Witman with right, ultrasound guided, supraclavicular block. Side rails up, monitors on throughout procedure. See vital signs in flow sheet. Tolerated Procedure well. 

## 2018-07-08 NOTE — Anesthesia Preprocedure Evaluation (Addendum)
Anesthesia Evaluation  Patient identified by MRN, date of birth, ID band Patient awake    Reviewed: Allergy & Precautions, NPO status , Patient's Chart, lab work & pertinent test results  History of Anesthesia Complications Negative for: history of anesthetic complications  Airway Mallampati: II  TM Distance: >3 FB Neck ROM: Full    Dental no notable dental hx. (+)    Pulmonary Current Smoker,    Pulmonary exam normal        Cardiovascular negative cardio ROS Normal cardiovascular exam     Neuro/Psych negative neurological ROS  negative psych ROS   GI/Hepatic negative GI ROS, Neg liver ROS,   Endo/Other  negative endocrine ROS  Renal/GU negative Renal ROS  negative genitourinary   Musculoskeletal negative musculoskeletal ROS (+)   Abdominal   Peds  Hematology negative hematology ROS (+)   Anesthesia Other Findings   Reproductive/Obstetrics                            Anesthesia Physical Anesthesia Plan  ASA: II  Anesthesia Plan: Regional   Post-op Pain Management:    Induction:   PONV Risk Score and Plan: 0 and Propofol infusion and Treatment may vary due to age or medical condition  Airway Management Planned: Nasal Cannula and Simple Face Mask  Additional Equipment: None  Intra-op Plan:   Post-operative Plan:   Informed Consent: I have reviewed the patients History and Physical, chart, labs and discussed the procedure including the risks, benefits and alternatives for the proposed anesthesia with the patient or authorized representative who has indicated his/her understanding and acceptance.       Plan Discussed with:   Anesthesia Plan Comments:         Anesthesia Quick Evaluation

## 2018-07-08 NOTE — Anesthesia Procedure Notes (Signed)
Anesthesia Regional Block: Supraclavicular block   Pre-Anesthetic Checklist: ,, timeout performed, Correct Patient, Correct Site, Correct Laterality, Correct Procedure, Correct Position, site marked, Risks and benefits discussed,  Surgical consent,  Pre-op evaluation,  At surgeon's request and post-op pain management  Laterality: Right  Prep: chloraprep       Needles:  Injection technique: Single-shot  Needle Type: Echogenic Stimulator Needle     Needle Length: 9cm  Needle Gauge: 21     Additional Needles:   Procedures:,,,, ultrasound used (permanent image in chart),,,,  Narrative:  Start time: 07/08/2018 1:45 PM End time: 07/08/2018 1:51 PM Injection made incrementally with aspirations every 5 mL.  Performed by: Personally  Anesthesiologist: Lucretia Kern, MD  Additional Notes: Monitors applied. Injection made in 5cc increments. No resistance to injection. Good needle visualization. Patient tolerated procedure well.

## 2018-07-08 NOTE — Op Note (Signed)
I assisted Surgeon(s) and Role:    * Betha Loa, MD - Primary    Cindee Salt, MD - Assisting on the Procedure(s): RIGHT SMALL FINGER WOUND EXPLORATION WITH REPAIR OF NERVE, TENDON AND ARTERY on 07/08/2018.  I provided assistance on this case as follows: setup, approach, repair of flexor tendons, identification of the artery and nerve, bringing the microscope into the field, repair of digital artery and nerve using the microscope, closure of the wound and application of the dressings and splint.  Electronically signed by: Cindee Salt, MD Date: 07/08/2018 Time: 4:58 PM

## 2018-07-09 ENCOUNTER — Encounter (HOSPITAL_BASED_OUTPATIENT_CLINIC_OR_DEPARTMENT_OTHER): Payer: Self-pay | Admitting: Orthopedic Surgery

## 2018-07-09 NOTE — Anesthesia Postprocedure Evaluation (Signed)
Anesthesia Post Note  Patient: Randy Potter  Procedure(s) Performed: RIGHT SMALL FINGER WOUND EXPLORATION WITH REPAIR OF NERVE, TENDON AND ARTERY (Right Finger)     Patient location during evaluation: PACU Anesthesia Type: Regional Level of consciousness: awake and alert Pain management: pain level controlled Vital Signs Assessment: post-procedure vital signs reviewed and stable Respiratory status: spontaneous breathing, nonlabored ventilation and respiratory function stable Cardiovascular status: blood pressure returned to baseline and stable Postop Assessment: no apparent nausea or vomiting Anesthetic complications: no    Last Vitals:  Vitals:   07/08/18 1730 07/08/18 1745  BP: (!) 137/98 (!) 145/75  Pulse: 87 89  Resp: (!) 22 16  Temp:  36.6 C  SpO2: 99% 100%    Last Pain:  Vitals:   07/09/18 0956  TempSrc:   PainSc: 0-No pain   Pain Goal:                   Lucretia Kern

## 2020-06-16 ENCOUNTER — Other Ambulatory Visit: Payer: Self-pay

## 2020-06-16 ENCOUNTER — Emergency Department (HOSPITAL_COMMUNITY)
Admission: EM | Admit: 2020-06-16 | Discharge: 2020-06-16 | Disposition: A | Discharge status: Home / Self Care | Payer: Medicaid Other | Attending: Emergency Medicine | Admitting: Emergency Medicine

## 2020-06-16 ENCOUNTER — Emergency Department (HOSPITAL_COMMUNITY): Payer: Medicaid Other

## 2020-06-16 DIAGNOSIS — F17210 Nicotine dependence, cigarettes, uncomplicated: Secondary | ICD-10-CM | POA: Insufficient documentation

## 2020-06-16 DIAGNOSIS — Y92019 Unspecified place in single-family (private) house as the place of occurrence of the external cause: Secondary | ICD-10-CM | POA: Insufficient documentation

## 2020-06-16 DIAGNOSIS — S0181XA Laceration without foreign body of other part of head, initial encounter: Secondary | ICD-10-CM

## 2020-06-16 DIAGNOSIS — Z23 Encounter for immunization: Secondary | ICD-10-CM | POA: Insufficient documentation

## 2020-06-16 DIAGNOSIS — S01411A Laceration without foreign body of right cheek and temporomandibular area, initial encounter: Secondary | ICD-10-CM | POA: Insufficient documentation

## 2020-06-16 MED ORDER — TETANUS-DIPHTH-ACELL PERTUSSIS 5-2.5-18.5 LF-MCG/0.5 IM SUSY
0.5000 mL | PREFILLED_SYRINGE | Freq: Once | INTRAMUSCULAR | Status: AC
  Administered 2020-06-16: 0.5 mL via INTRAMUSCULAR
  Filled 2020-06-16: qty 0.5
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
## 2020-06-16 NOTE — ED Notes (Signed)
[employer] and [employer] at bedside.
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
## 2020-06-16 NOTE — Discharge Instructions (Addendum)
Keep wound clean and dry.  Do not apply antibiotic ointment to your face as this will take the glue off.  Glue should fall off on its own in the next 5 to 7 days.  Information given for [facility], call to schedule an appointment as discussed for management of your diabetes.

## 2020-10-09 ENCOUNTER — Ambulatory Visit: Payer: Self-pay

## 2020-10-14 ENCOUNTER — Ambulatory Visit: Payer: Self-pay

## 2020-12-12 ENCOUNTER — Other Ambulatory Visit: Payer: Self-pay

## 2020-12-12 ENCOUNTER — Ambulatory Visit (HOSPITAL_COMMUNITY)
Admission: EM | Admit: 2020-12-12 | Discharge: 2020-12-12 | Disposition: A | Discharge status: Home / Self Care | Payer: Self-pay | Attending: Student | Admitting: Student

## 2020-12-12 ENCOUNTER — Encounter (HOSPITAL_COMMUNITY): Payer: Self-pay

## 2020-12-12 DIAGNOSIS — E1140 Type 2 diabetes mellitus with diabetic neuropathy, unspecified: Secondary | ICD-10-CM

## 2020-12-12 DIAGNOSIS — E1149 Type 2 diabetes mellitus with other diabetic neurological complication: Secondary | ICD-10-CM

## 2020-12-12 DIAGNOSIS — Z7984 Long term (current) use of oral hypoglycemic drugs: Secondary | ICD-10-CM

## 2020-12-12 MED ORDER — GABAPENTIN 100 MG PO CAPS: 100.0000 mg | ORAL_CAPSULE | Freq: Every day | ORAL | 0 refills | Status: DC

## 2020-12-12 MED ORDER — GABAPENTIN 100 MG PO CAPS
100.0000 mg | ORAL_CAPSULE | Freq: Every day | ORAL | 0 refills | Status: DC
  Filled 2020-12-12: qty 30, 15d supply, fill #0

## 2020-12-12 NOTE — Discharge Instructions (Addendum)
-  Please start your diabetic medications -PCP referral placed -Start gabapentin

## 2020-12-12 NOTE — ED Provider Notes (Signed)
[facility]    CSN: [id number] Arrival date & time: 12/12/20  1230      History   Chief Complaint Chief Complaint  Patient presents with   Leg Pain    HPI Randy Potter is a 40 y.o. male presenting with pain in legs and feet x6 months following diabetes diagnosis. Medical history diabetes, depression, arthritis. Describes this as burning and tingling radiating from toes to anterior thighs. Denies back pain, weakness, urinary sytmptoms. A1c was 11.7 last check; has not start the prescribed medications for this. Does not have a PCP.  HPI  Past Medical History:  Diagnosis Date   Anxiety    Arthritis    Depression     There are no problems to display for this patient.   Past Surgical History:  Procedure Laterality Date   INCISION AND DRAINAGE OF WOUND  09/28/2011   Procedure: IRRIGATION AND DEBRIDEMENT WOUND;  Surgeon: Cammy Copa, MD;  Location: [facility];  Service: Orthopedics;  Laterality: Right;  Complex closure of laceration   IRRIGATION AND DEBRIDEMENT KNEE  09/28/2011   Procedure: IRRIGATION AND DEBRIDEMENT KNEE;  Surgeon: Cammy Copa, MD;  Location: [facility] OR;  Service: Orthopedics;  Laterality: Left;   KNEE ARTHROSCOPY  09/28/2011   Procedure: ARTHROSCOPY KNEE;  Surgeon: Cammy Copa, MD;  Location: [facility] OR;  Service: Orthopedics;  Laterality: Left;   NERVE, TENDON AND ARTERY REPAIR Right 07/08/2018   Procedure: RIGHT SMALL FINGER WOUND EXPLORATION WITH REPAIR OF NERVE, TENDON AND ARTERY;  Surgeon: Betha Loa, MD;  Location: [facility];  Service: Orthopedics;  Laterality: Right;       Home Medications    Prior to Admission medications   Medication Sig Start Date End Date Taking? Authorizing Provider  gabapentin (NEURONTIN) 100 MG capsule Take 1 capsule (100 mg total) by mouth at bedtime. Start with 1 pill at bedtime. Can increase to 2 pills at bedtime if symptoms persist. 12/12/20  Yes Rhys Martini, PA-C  cephALEXin (KEFLEX)  500 MG capsule Take 500 mg by mouth 4 (four) times daily. Patient not taking: No sig reported    [provider]  oxyCODONE-acetaminophen (PERCOCET) 5-325 MG tablet 1-2 tabs PO q6 hours prn pain Patient not taking: Reported on 06/16/2020 07/08/18   Betha Loa, MD    Family History Family History  Family history unknown: Yes    Social History Social History   Tobacco Use   Smoking status: Every Day    Packs/day: 1.00    Years: 15.00    Pack years: 15.00    Types: Cigarettes   Smokeless tobacco: Never  Vaping Use   Vaping Use: Never used  Substance Use Topics   Alcohol use: No    Alcohol/week: 3.0 standard drinks    Types: 3 Shots of liquor per week   Drug use: No    Frequency: 3.0 times per week    Types: Marijuana    Comment: last marijuana 2 weeks ago     Allergies   Patient has no known allergies.   Review of Systems Review of Systems  Musculoskeletal:        Bilateral lower extremity pain  All other systems reviewed and are negative.   Physical Exam Triage Vital Signs ED Triage Vitals  Enc Vitals Group     BP      Pulse      Resp      Temp      Temp src  SpO2      Weight      Height      Head Circumference      Peak Flow      Pain Score      Pain Loc      Pain Edu?      Excl. in GC?    No data found.  Updated Vital Signs BP (!) 141/96 (BP Location: Right Arm)   Pulse 77   Temp 99 F (37.2 C) (Oral)   Resp 20   SpO2 100%   Visual Acuity Right Eye Distance:   Left Eye Distance:   Bilateral Distance:    Right Eye Near:   Left Eye Near:    Bilateral Near:     Physical Exam Vitals reviewed.  Constitutional:      General: He is not in acute distress.    Appearance: Normal appearance. He is not ill-appearing or diaphoretic.  HENT:     Head: Normocephalic and atraumatic.  Cardiovascular:     Rate and Rhythm: Normal rate and regular rhythm.     Pulses:          Dorsalis pedis pulses are 2+ on the right side and 2+ on  the left side.     Heart sounds: Normal heart sounds.  Pulmonary:     Effort: Pulmonary effort is normal.     Breath sounds: Normal breath sounds.  Skin:    General: Skin is warm.  Neurological:     General: No focal deficit present.     Mental Status: He is alert and oriented to person, place, and time.     Comments: Sensation intact bilateral LEs   Psychiatric:        Mood and Affect: Mood normal.        Behavior: Behavior normal.        Thought Content: Thought content normal.        Judgment: Judgment normal.     UC Treatments / Results  Labs (all labs ordered are listed, but only abnormal results are displayed) Labs Reviewed - No data to display  EKG   Radiology No results found.  Procedures Procedures (including critical care time)  Medications Ordered in UC Medications - No data to display  Initial Impression / Assessment and Plan / UC Course  I have reviewed the triage vital signs and the nursing notes.  Pertinent labs & imaging results that were available during my care of the patient were reviewed by me and considered in my medical decision making (see chart for details).     This patient is a very pleasant 40 y.o. year old male presenting with diabetic neuropathy related to untreated diabetes. A1c 11.7 04/2020. Has not picked up the prescribed medications for this.  Strongly encouraged him to pick these up and start them.  Gabapentin sent for diabetic neuropathy.  PCP referral sent. ED return precautions discussed. Patient verbalizes understanding and agreement.    Final Clinical Impressions(s) / UC Diagnoses   Final diagnoses:  Other diabetic neurological complication associated with type 2 diabetes mellitus (HCC)  Type 2 diabetes mellitus with diabetic neuropathy, without long-term current use of insulin [facility])     Discharge Instructions      -Please start your diabetic medications -PCP referral placed -Start gabapentin     ED Prescriptions      Medication Sig Dispense Auth. Provider   gabapentin (NEURONTIN) 100 MG capsule Take 1 capsule (100 mg total) by mouth at bedtime. Start  with 1 pill at bedtime. Can increase to 2 pills at bedtime if symptoms persist. 60 capsule Rhys Martini, PA-C      PDMP not reviewed this encounter.   Rhys Martini, PA-C 12/12/20 1359

## 2020-12-12 NOTE — ED Triage Notes (Signed)
Pt presents with chronic leg and foot pain from diabetic neuropathy.  Pt is non compliant diabetic since being diagnosed in January of 2022 with a 11.7 A1C; pt states he couldn't afford medication prescribed to help control and has not followed with any provider since then.

## 2020-12-19 ENCOUNTER — Other Ambulatory Visit: Payer: Self-pay

## 2022-07-04 ENCOUNTER — Ambulatory Visit: Payer: Self-pay | Admitting: Medical

## 2022-07-08 ENCOUNTER — Ambulatory Visit: Payer: Medicaid Other | Admitting: Family

## 2022-07-08 ENCOUNTER — Encounter: Payer: Self-pay | Admitting: Family

## 2022-07-08 VITALS — BP 142/86 | HR 80 | Resp 18 | Ht 67.0 in | Wt 174.8 lb

## 2022-07-08 DIAGNOSIS — R7309 Other abnormal glucose: Secondary | ICD-10-CM

## 2022-07-08 DIAGNOSIS — E119 Type 2 diabetes mellitus without complications: Secondary | ICD-10-CM | POA: Diagnosis not present

## 2022-07-08 MED ORDER — VALSARTAN 80 MG PO TABS: 80.0000 mg | ORAL_TABLET | Freq: Every day | ORAL | 0 refills | Status: AC

## 2022-07-08 NOTE — Progress Notes (Signed)
Randy Potter is a 42 y.o. male with the following history as recorded in EpicCare:  There are no problems to display for this patient.   Current Outpatient Medications  Medication Sig Dispense Refill   valsartan (DIOVAN) 80 MG tablet Take 1 tablet (80 mg total) by mouth daily. 90 tablet 0   No current facility-administered medications for this visit.    Allergies: Patient has no known allergies.  Past Medical History:  Diagnosis Date   Anxiety    Arthritis    Depression    Diabetes mellitus without complication ([place])    Hypertension     Past Surgical History:  Procedure Laterality Date   INCISION AND DRAINAGE OF WOUND  09/28/2011   Procedure: IRRIGATION AND DEBRIDEMENT WOUND;  Surgeon: Meredith Pel, MD;  Location: [place];  Service: Orthopedics;  Laterality: Right;  Complex closure of laceration   IRRIGATION AND DEBRIDEMENT KNEE  09/28/2011   Procedure: IRRIGATION AND DEBRIDEMENT KNEE;  Surgeon: Meredith Pel, MD;  Location: [place];  Service: Orthopedics;  Laterality: Left;   KNEE ARTHROSCOPY  09/28/2011   Procedure: ARTHROSCOPY KNEE;  Surgeon: Meredith Pel, MD;  Location: [place];  Service: Orthopedics;  Laterality: Left;   NERVE, TENDON AND ARTERY REPAIR Right 07/08/2018   Procedure: RIGHT SMALL FINGER WOUND EXPLORATION WITH REPAIR OF NERVE, TENDON AND ARTERY;  Surgeon: Leanora Cover, MD;  Location: [place];  Service: Orthopedics;  Laterality: Right;    Family History  Family history unknown: Yes    Social History   Tobacco Use   Smoking status: Every Day    Packs/day: 1.00    Years: 15.00    Total pack years: 15.00    Types: Cigarettes   Smokeless tobacco: Never  Substance Use Topics   Alcohol use: No    Alcohol/week: 3.0 standard drinks of alcohol    Types: 3 Shots of liquor per week    Subjective:   Presents today as a new patient; per records, he has a history of Type 2 Diabetes; hgba1c was 11.7 from records in 2022; was diagnosed in  2021 but per patient has not ever taken any medication;  Smokes 1/2 ppd x 15 years;  Increased thirst/ urination; does not check his blood sugar;   Objective:  Vitals:   07/08/22 1531 07/08/22 1632  BP: (!) 142/86 (!) 142/86  Pulse: 80   Resp: 18   SpO2: 98%   Weight: 174 lb 12.8 oz (79.3 kg)   Height: '5\' 7"'$  (1.702 m)     General: Well developed, well nourished, in no acute distress  Skin : Warm and dry.  Head: Normocephalic and atraumatic  Eyes: Sclera and conjunctiva clear; pupils round and reactive to light; extraocular movements intact  Ears: External normal; canals clear; tympanic membranes normal  Oropharynx: Pink, supple. No suspicious lesions  Neck: Supple without thyromegaly, adenopathy  Lungs: Respirations unlabored; clear to auscultation bilaterally without wheeze, rales, rhonchi  CVS exam: normal rate and regular rhythm.  Neurologic: Alert and oriented; speech intact; face symmetrical; moves all extremities well; CNII-XII intact without focal deficit   Assessment:  1. Type 2 diabetes mellitus without complication, unspecified whether long term insulin use (HCC)   2. Elevated glucose     Plan:  Diagnosed over 3 years ago but has never been able to afford any medication; per records, Hgba1c was at 11.7 in 2022; ? Type 1.5 diabetes based on history provided by patient and wife; will most likely need  to refer to endocrinology; will go ahead and start Diovan 80 mg to protect kidneys; follow up to be determined;   No follow-ups on file.  Orders Placed This Encounter  Procedures   CBC with Differential/Platelet   Comp Met (CMET)   Lipid panel   Hemoglobin A1c   C-peptide   Insulin, random   Urine Microalbumin w/creat. ratio    Requested Prescriptions   Signed Prescriptions Disp Refills   valsartan (DIOVAN) 80 MG tablet 90 tablet 0    Sig: Take 1 tablet (80 mg total) by mouth daily.

## 2022-07-09 LAB — CBC WITH DIFFERENTIAL/PLATELET
Basophils Absolute: 0.1 10*3/uL (ref 0.0–0.1)
Basophils Relative: 0.8 % (ref 0.0–3.0)
Eosinophils Absolute: 0.1 10*3/uL (ref 0.0–0.7)
Eosinophils Relative: 1.1 % (ref 0.0–5.0)
HCT: 49 % (ref 39.0–52.0)
Hemoglobin: 16.4 g/dL (ref 13.0–17.0)
Lymphocytes Relative: 27.3 % (ref 12.0–46.0)
Lymphs Abs: 2.1 10*3/uL (ref 0.7–4.0)
MCHC: 33.4 g/dL (ref 30.0–36.0)
MCV: 86.4 fl (ref 78.0–100.0)
Monocytes Absolute: 0.7 10*3/uL (ref 0.1–1.0)
Monocytes Relative: 8.8 % (ref 3.0–12.0)
Neutro Abs: 4.7 10*3/uL (ref 1.4–7.7)
Neutrophils Relative %: 62 % (ref 43.0–77.0)
Platelets: 201 10*3/uL (ref 150.0–400.0)
RBC: 5.67 Mil/uL (ref 4.22–5.81)
RDW: 13.4 % (ref 11.5–15.5)
WBC: 7.6 10*3/uL (ref 4.0–10.5)

## 2022-07-09 LAB — LIPID PANEL
Cholesterol: 220 mg/dL — ABNORMAL HIGH (ref 0–200)
HDL: 43 mg/dL (ref >39.00)
LDL Cholesterol: 153 mg/dL — ABNORMAL HIGH (ref 0–99)
NonHDL: 177.24
Total CHOL/HDL Ratio: 5
Triglycerides: 123 mg/dL (ref 0.0–149.0)
VLDL: 24.6 mg/dL (ref 0.0–40.0)

## 2022-07-09 LAB — MICROALBUMIN / CREATININE URINE RATIO
Creatinine,U: 191.2 mg/dL
Microalb Creat Ratio: 1 mg/g (ref 0.0–30.0)
Microalb, Ur: 1.9 mg/dL (ref 0.0–1.9)

## 2022-07-09 LAB — COMPREHENSIVE METABOLIC PANEL
ALT: 15 U/L (ref 0–53)
AST: 12 U/L (ref 0–37)
Albumin: 4.2 g/dL (ref 3.5–5.2)
Alkaline Phosphatase: 105 U/L (ref 39–117)
BUN: 12 mg/dL (ref 6–23)
CO2: 29 mEq/L (ref 19–32)
Calcium: 9.7 mg/dL (ref 8.4–10.5)
Chloride: 99 mEq/L (ref 96–112)
Creatinine, Ser: 0.85 mg/dL (ref 0.40–1.50)
GFR: 108.77 mL/min (ref >60.00)
Glucose, Bld: 310 mg/dL — ABNORMAL HIGH (ref 70–99)
Potassium: 4.2 mEq/L (ref 3.5–5.1)
Sodium: 135 mEq/L (ref 135–145)
Total Bilirubin: 0.5 mg/dL (ref 0.2–1.2)
Total Protein: 7.1 g/dL (ref 6.0–8.3)

## 2022-07-09 LAB — C-PEPTIDE: C-Peptide: 1.24 ng/mL (ref 0.80–3.85)

## 2022-07-09 LAB — INSULIN, RANDOM: Insulin: 4.3 u[IU]/mL

## 2022-07-09 LAB — HEMOGLOBIN A1C: Hgb A1c MFr Bld: 11.7 % — ABNORMAL HIGH (ref 4.6–6.5)

## 2022-07-10 ENCOUNTER — Other Ambulatory Visit: Payer: Self-pay | Admitting: Family

## 2022-07-10 ENCOUNTER — Telehealth: Payer: Self-pay | Admitting: Family

## 2022-07-10 MED ORDER — INSULIN GLARGINE 100 UNITS/ML SOLOSTAR PEN: 10.0000 [IU] | PEN_INJECTOR | Freq: Every day | SUBCUTANEOUS | 0 refills | Status: DC

## 2022-07-10 MED ORDER — INSULIN PEN NEEDLE 32G X 6 MM MISC: 0 refills | Status: DC

## 2022-07-10 MED ORDER — BLOOD GLUCOSE MONITORING SUPPL DEVI: 1.0000 | Freq: Three times a day (TID) | 0 refills | Status: DC

## 2022-07-10 MED ORDER — BLOOD GLUCOSE TEST VI STRP: 1.0000 | ORAL_STRIP | Freq: Three times a day (TID) | 0 refills | Status: AC

## 2022-07-10 MED ORDER — LANCETS MISC. MISC: 1.0000 | Freq: Three times a day (TID) | 0 refills | Status: AC

## 2022-07-10 MED ORDER — LANCET DEVICE MISC: 1.0000 | Freq: Three times a day (TID) | 0 refills | Status: AC

## 2022-07-10 MED ORDER — LANTUS SOLOSTAR 100 UNIT/ML ~~LOC~~ SOPN: 10.0000 [IU] | PEN_INJECTOR | Freq: Every day | SUBCUTANEOUS | 0 refills | Status: AC

## 2022-07-10 NOTE — Telephone Encounter (Signed)
I left message for patient to call back to review lab findings. I would like to get him scheduled with Tammy tomorrow if possible so she can do insulin and glucometer teachings.  She offered 11:30, 12:30, 1 or 3 pm tomorrow.

## 2022-07-10 NOTE — Telephone Encounter (Signed)
Rx has been sent in. 

## 2022-07-10 NOTE — Telephone Encounter (Signed)
Pt states the pharmacy has received everything but the insulin. He would like that [facility]   [id number]: [phone] Fax: [phone]

## 2022-07-10 NOTE — Telephone Encounter (Signed)
Called pt and left a VM asking pt to call the office back.  

## 2022-07-11 NOTE — Telephone Encounter (Signed)
Called pt, was not able to leave a VM.

## 2022-07-18 NOTE — Telephone Encounter (Signed)
Called pt again to check on him, was not able to leave a VM.

## 2022-07-23 NOTE — Progress Notes (Signed)
Letter has been placed up front to be mailed to pt.

## 2022-07-29 ENCOUNTER — Telehealth: Payer: Self-pay | Admitting: Family

## 2022-07-29 ENCOUNTER — Other Ambulatory Visit: Payer: Self-pay | Admitting: Family

## 2022-07-29 NOTE — Telephone Encounter (Signed)
Pt's fiance stated she thought he was supposed to be getting a referral to a diabetes specialist. Please advise pt as she is not on dpr.

## 2022-07-29 NOTE — Telephone Encounter (Signed)
Called pt, was not able to leave a VM due to VM not being set up.

## 2022-07-29 NOTE — Telephone Encounter (Signed)
LM- was he supposed to have a referral for endo? Or is this the apt with Tammy that she is referring to?  Once clarified I will call Erasmo Downer to make her aware that we can not speak to her since she is NOT listed on his DPR, and to see if she can have him return our phone calls since we have been trying to call him on so many different occasions to no avail.

## 2022-07-31 NOTE — Telephone Encounter (Signed)
Called pt again was not able to leave a VM, VM is not set up.

## 2022-08-01 NOTE — Telephone Encounter (Signed)
Sent pt a message via Mychart. ?
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
## 2023-01-21 ENCOUNTER — Other Ambulatory Visit: Payer: Self-pay | Admitting: Family

## 2023-01-23 MED ORDER — BLOOD GLUCOSE MONITORING SUPPL DEVI: 1.0000 | Freq: Three times a day (TID) | 0 refills | Status: AC

## 2023-01-23 MED ORDER — INSULIN PEN NEEDLE 32G X 6 MM MISC: 0 refills | Status: AC

## 2023-02-03 ENCOUNTER — Ambulatory Visit: Payer: MEDICAID | Admitting: Family
